# Patient Record
Sex: Female | Born: 1955 | Race: White | Hispanic: No | Marital: Married | Smoking: Former smoker
Health system: Southern US, Community
[De-identification: ages and names within clinical notes are randomized; demographics above are authoritative.]

## PROBLEM LIST (undated history)

## (undated) DIAGNOSIS — K589 Irritable bowel syndrome without diarrhea: Secondary | ICD-10-CM

## (undated) DIAGNOSIS — R112 Nausea with vomiting, unspecified: Secondary | ICD-10-CM

## (undated) DIAGNOSIS — Z9889 Other specified postprocedural states: Secondary | ICD-10-CM

## (undated) DIAGNOSIS — R51 Headache: Secondary | ICD-10-CM

## (undated) DIAGNOSIS — G43909 Migraine, unspecified, not intractable, without status migrainosus: Secondary | ICD-10-CM

## (undated) DIAGNOSIS — Z9109 Other allergy status, other than to drugs and biological substances: Secondary | ICD-10-CM

## (undated) DIAGNOSIS — E0500 Thyrotoxicosis with diffuse goiter without thyrotoxic crisis or storm: Secondary | ICD-10-CM

## (undated) HISTORY — DX: Migraine, unspecified, not intractable, without status migrainosus: G43.909

---

## 1979-02-28 HISTORY — PX: ABDOMINAL HYSTERECTOMY: SHX81

## 2001-12-13 ENCOUNTER — Encounter: Payer: Self-pay | Admitting: Orthopedic Surgery

## 2001-12-13 ENCOUNTER — Ambulatory Visit (HOSPITAL_COMMUNITY): Admission: RE | Admit: 2001-12-13 | Discharge: 2001-12-13 | Payer: Self-pay | Admitting: Orthopedic Surgery

## 2002-01-19 ENCOUNTER — Encounter: Payer: Self-pay | Admitting: Orthopedic Surgery

## 2002-01-25 ENCOUNTER — Encounter: Payer: Self-pay | Admitting: Orthopedic Surgery

## 2002-01-25 ENCOUNTER — Ambulatory Visit (HOSPITAL_COMMUNITY): Admission: RE | Admit: 2002-01-25 | Discharge: 2002-01-25 | Payer: Self-pay | Admitting: Orthopedic Surgery

## 2003-06-30 HISTORY — PX: WASH SINUS: SUR1443

## 2004-06-29 HISTORY — PX: HIP SURGERY: SHX245

## 2004-08-28 ENCOUNTER — Ambulatory Visit: Payer: Self-pay | Admitting: Family Medicine

## 2005-03-04 ENCOUNTER — Ambulatory Visit: Payer: Self-pay | Admitting: Otolaryngology

## 2005-03-09 ENCOUNTER — Ambulatory Visit: Payer: Self-pay | Admitting: Otolaryngology

## 2005-03-21 ENCOUNTER — Inpatient Hospital Stay: Payer: Self-pay | Admitting: Otolaryngology

## 2006-03-24 ENCOUNTER — Ambulatory Visit: Payer: Self-pay | Admitting: Family Medicine

## 2011-07-09 ENCOUNTER — Other Ambulatory Visit: Payer: Self-pay | Admitting: Neurosurgery

## 2011-07-09 DIAGNOSIS — M5410 Radiculopathy, site unspecified: Secondary | ICD-10-CM

## 2011-07-09 DIAGNOSIS — M542 Cervicalgia: Secondary | ICD-10-CM

## 2011-07-13 ENCOUNTER — Ambulatory Visit
Admission: RE | Admit: 2011-07-13 | Discharge: 2011-07-13 | Disposition: A | Discharge status: Home / Self Care | Payer: BC Managed Care – PPO | Source: Ambulatory Visit | Attending: Neurosurgery | Admitting: Neurosurgery

## 2011-07-13 DIAGNOSIS — M542 Cervicalgia: Secondary | ICD-10-CM

## 2011-07-13 DIAGNOSIS — M5410 Radiculopathy, site unspecified: Secondary | ICD-10-CM

## 2011-07-13 MED ORDER — IOHEXOL 300 MG/ML  SOLN
10.0000 mL | Freq: Once | INTRAMUSCULAR | Status: AC | PRN
  Administered 2011-07-13: 10 mL via INTRATHECAL

## 2011-07-13 MED ORDER — DIAZEPAM 5 MG PO TABS
10.0000 mg | ORAL_TABLET | Freq: Once | ORAL | Status: AC
  Administered 2011-07-13: 5 mg via ORAL

## 2011-07-13 NOTE — Progress Notes (Signed)
Patient resting quietly without complaint, denies pain.  Husband at bedside.  jkl

## 2011-07-21 ENCOUNTER — Other Ambulatory Visit: Payer: Self-pay | Admitting: Neurosurgery

## 2011-07-23 ENCOUNTER — Encounter (HOSPITAL_COMMUNITY): Payer: Self-pay | Admitting: *Deleted

## 2011-07-23 ENCOUNTER — Encounter (HOSPITAL_COMMUNITY): Payer: Self-pay | Admitting: Pharmacy Technician

## 2011-07-23 ENCOUNTER — Encounter (HOSPITAL_COMMUNITY)
Admission: RE | Admit: 2011-07-23 | Discharge: 2011-07-23 | Disposition: A | Discharge status: Home / Self Care | Payer: BC Managed Care – PPO | Source: Ambulatory Visit | Attending: Neurosurgery | Admitting: Neurosurgery

## 2011-07-23 LAB — CBC
Hemoglobin: 13.6 g/dL (ref 12.0–15.0)
MCH: 31.1 pg (ref 26.0–34.0)
Platelets: 291 10*3/uL (ref 150–400)
RBC: 4.38 MIL/uL (ref 3.87–5.11)
WBC: 7.5 10*3/uL (ref 4.0–10.5)

## 2011-07-23 LAB — SURGICAL PCR SCREEN
MRSA, PCR: NEGATIVE
Staphylococcus aureus: NEGATIVE

## 2011-07-23 NOTE — Pre-Procedure Instructions (Signed)
20 Tammy [name]  07/23/2011   Your procedure is scheduled on:  Wednesday 07/29/11     Report to [facility] at 900 AM.  Call this number if you have problems the morning of surgery: [phone]   Remember:   Do not eat food:After Midnight.  May have clear liquids: up to 4 Hours before arrival.  Clear liquids include soda, tea, black coffee, apple or grape juice, broth.  Take these medicines the morning of surgery with A SIP OF WATER:  STOP ASPIRIN, COUMADIN, PLAVIX, EFFIENT, HERBAL MEDICINES   Do not wear jewelry, make-up or nail polish.  Do not wear lotions, powders, or perfumes. You may wear deodorant.  Do not shave 48 hours prior to surgery.  Do not bring valuables to the hospital.  Contacts, dentures or bridgework may not be worn into surgery.  Leave suitcase in the car. After surgery it may be brought to your room.  For patients admitted to the hospital, checkout time is 11:00 AM the day of discharge.   Patients discharged the day of surgery will not be allowed to drive home.  Name and phone number of your driver:   Special Instructions: CHG Shower Use Special Wash: 1/2 bottle night before surgery and 1/2 bottle morning of surgery.   Please read over the following fact sheets that you were given: Pain Booklet, Coughing and Deep Breathing, MRSA Information and Surgical Site Infection Prevention

## 2011-07-24 ENCOUNTER — Other Ambulatory Visit (HOSPITAL_COMMUNITY): Payer: BC Managed Care – PPO

## 2011-07-28 MED ORDER — CEFAZOLIN SODIUM 1-5 GM-% IV SOLN
1.0000 g | INTRAVENOUS | Status: DC
  Filled 2011-07-28: qty 50

## 2011-07-29 ENCOUNTER — Encounter (HOSPITAL_COMMUNITY): Payer: Self-pay | Admitting: Anesthesiology

## 2011-07-29 ENCOUNTER — Ambulatory Visit (HOSPITAL_COMMUNITY)
Admission: RE | Admit: 2011-07-29 | Discharge: 2011-07-30 | Disposition: A | Discharge status: Home / Self Care | Payer: BC Managed Care – PPO | Source: Ambulatory Visit | Attending: Neurosurgery | Admitting: Neurosurgery

## 2011-07-29 ENCOUNTER — Ambulatory Visit (HOSPITAL_COMMUNITY): Payer: BC Managed Care – PPO

## 2011-07-29 ENCOUNTER — Encounter (HOSPITAL_COMMUNITY): Payer: Self-pay | Admitting: *Deleted

## 2011-07-29 ENCOUNTER — Encounter (HOSPITAL_COMMUNITY)
Admission: RE | Disposition: A | Discharge status: Home / Self Care | Payer: Self-pay | Source: Ambulatory Visit | Attending: Neurosurgery

## 2011-07-29 ENCOUNTER — Ambulatory Visit (HOSPITAL_COMMUNITY): Payer: BC Managed Care – PPO | Admitting: Anesthesiology

## 2011-07-29 DIAGNOSIS — M4712 Other spondylosis with myelopathy, cervical region: Secondary | ICD-10-CM

## 2011-07-29 DIAGNOSIS — Z01818 Encounter for other preprocedural examination: Secondary | ICD-10-CM | POA: Insufficient documentation

## 2011-07-29 DIAGNOSIS — Z01812 Encounter for preprocedural laboratory examination: Secondary | ICD-10-CM | POA: Insufficient documentation

## 2011-07-29 DIAGNOSIS — Z79899 Other long term (current) drug therapy: Secondary | ICD-10-CM | POA: Insufficient documentation

## 2011-07-29 DIAGNOSIS — M5020 Other cervical disc displacement, unspecified cervical region: Secondary | ICD-10-CM | POA: Insufficient documentation

## 2011-07-29 DIAGNOSIS — Z7902 Long term (current) use of antithrombotics/antiplatelets: Secondary | ICD-10-CM | POA: Insufficient documentation

## 2011-07-29 DIAGNOSIS — M47812 Spondylosis without myelopathy or radiculopathy, cervical region: Secondary | ICD-10-CM | POA: Insufficient documentation

## 2011-07-29 HISTORY — DX: Headache: R51

## 2011-07-29 HISTORY — DX: Nausea with vomiting, unspecified: R11.2

## 2011-07-29 HISTORY — DX: Other allergy status, other than to drugs and biological substances: Z91.09

## 2011-07-29 HISTORY — DX: Other specified postprocedural states: Z98.890

## 2011-07-29 HISTORY — PX: ANTERIOR CERVICAL DECOMP/DISCECTOMY FUSION: SHX1161

## 2011-07-29 SURGERY — ANTERIOR CERVICAL DECOMPRESSION/DISCECTOMY FUSION 3 LEVEL/HARDWARE REMOVAL
Anesthesia: General | Site: Neck | Laterality: Bilateral | Wound class: Clean

## 2011-07-29 MED ORDER — DOCUSATE SODIUM 100 MG PO CAPS
100.0000 mg | ORAL_CAPSULE | Freq: Two times a day (BID) | ORAL | Status: DC
  Administered 2011-07-29: 100 mg via ORAL
  Filled 2011-07-29: qty 1

## 2011-07-29 MED ORDER — DROPERIDOL 2.5 MG/ML IJ SOLN
INTRAMUSCULAR | Status: DC | PRN
  Administered 2011-07-29: 0.625 mg via INTRAVENOUS

## 2011-07-29 MED ORDER — PHENYLEPHRINE HCL 10 MG/ML IJ SOLN
INTRAMUSCULAR | Status: DC | PRN
  Administered 2011-07-29 (×4): 80 ug via INTRAVENOUS
  Administered 2011-07-29: 40 ug via INTRAVENOUS

## 2011-07-29 MED ORDER — GLYCOPYRROLATE 0.2 MG/ML IJ SOLN
INTRAMUSCULAR | Status: DC | PRN
  Administered 2011-07-29: .3 mg via INTRAVENOUS

## 2011-07-29 MED ORDER — BACITRACIN 50000 UNITS IM SOLR
INTRAMUSCULAR | Status: DC | PRN
  Administered 2011-07-29: 13:00:00

## 2011-07-29 MED ORDER — DEXAMETHASONE 4 MG PO TABS
4.0000 mg | ORAL_TABLET | Freq: Four times a day (QID) | ORAL | Status: AC
Start: 2011-07-29 — End: 2011-07-29

## 2011-07-29 MED ORDER — MENTHOL 3 MG MT LOZG: 1.0000 | LOZENGE | OROMUCOSAL | Status: DC | PRN

## 2011-07-29 MED ORDER — ONDANSETRON HCL 4 MG/2ML IJ SOLN: 4.0000 mg | INTRAMUSCULAR | Status: DC | PRN

## 2011-07-29 MED ORDER — OXYCODONE-ACETAMINOPHEN 5-325 MG PO TABS
1.0000 | ORAL_TABLET | ORAL | Status: DC | PRN
  Administered 2011-07-29 – 2011-07-30 (×3): 2 via ORAL
  Filled 2011-07-29 (×3): qty 2

## 2011-07-29 MED ORDER — SUFENTANIL CITRATE 50 MCG/ML IV SOLN
INTRAVENOUS | Status: DC | PRN
  Administered 2011-07-29: 10 ug via INTRAVENOUS
  Administered 2011-07-29: 5 ug via INTRAVENOUS
  Administered 2011-07-29: 15 ug via INTRAVENOUS
  Administered 2011-07-29 (×2): 10 ug via INTRAVENOUS

## 2011-07-29 MED ORDER — 0.9 % SODIUM CHLORIDE (POUR BTL) OPTIME
TOPICAL | Status: DC | PRN
  Administered 2011-07-29: 1000 mL

## 2011-07-29 MED ORDER — MIDAZOLAM HCL 5 MG/5ML IJ SOLN
INTRAMUSCULAR | Status: DC | PRN
  Administered 2011-07-29: 2 mg via INTRAVENOUS

## 2011-07-29 MED ORDER — LACTATED RINGERS IV SOLN: INTRAVENOUS | Status: DC

## 2011-07-29 MED ORDER — DEXAMETHASONE SODIUM PHOSPHATE 4 MG/ML IJ SOLN
4.0000 mg | Freq: Four times a day (QID) | INTRAMUSCULAR | Status: AC
  Administered 2011-07-29: 4 mg via INTRAVENOUS
  Filled 2011-07-29: qty 1

## 2011-07-29 MED ORDER — ONDANSETRON HCL 4 MG/2ML IJ SOLN: 4.0000 mg | Freq: Four times a day (QID) | INTRAMUSCULAR | Status: DC | PRN

## 2011-07-29 MED ORDER — MORPHINE SULFATE 4 MG/ML IJ SOLN: 1.0000 mg | INTRAMUSCULAR | Status: DC | PRN

## 2011-07-29 MED ORDER — FLUTICASONE FUROATE 27.5 MCG/SPRAY NA SUSP: 2.0000 | Freq: Every day | NASAL | Status: DC | PRN

## 2011-07-29 MED ORDER — ZOLPIDEM TARTRATE 5 MG PO TABS: 10.0000 mg | ORAL_TABLET | Freq: Every evening | ORAL | Status: DC | PRN

## 2011-07-29 MED ORDER — LEVOCETIRIZINE DIHYDROCHLORIDE 5 MG PO TABS: 5.0000 mg | ORAL_TABLET | Freq: Every evening | ORAL | Status: DC

## 2011-07-29 MED ORDER — MONTELUKAST SODIUM 10 MG PO TABS
10.0000 mg | ORAL_TABLET | Freq: Every day | ORAL | Status: DC
  Administered 2011-07-29: 10 mg via ORAL
  Filled 2011-07-29 (×2): qty 1

## 2011-07-29 MED ORDER — ACETAMINOPHEN 325 MG PO TABS: 650.0000 mg | ORAL_TABLET | ORAL | Status: DC | PRN

## 2011-07-29 MED ORDER — BACITRACIN 50000 UNITS IM SOLR
INTRAMUSCULAR | Status: AC
  Filled 2011-07-29: qty 1

## 2011-07-29 MED ORDER — LORATADINE 10 MG PO TABS
10.0000 mg | ORAL_TABLET | Freq: Every day | ORAL | Status: DC
  Administered 2011-07-29: 10 mg via ORAL
  Filled 2011-07-29 (×2): qty 1

## 2011-07-29 MED ORDER — PHENOL 1.4 % MT LIQD: 1.0000 | OROMUCOSAL | Status: DC | PRN

## 2011-07-29 MED ORDER — HYDROMORPHONE HCL PF 1 MG/ML IJ SOLN
0.2500 mg | INTRAMUSCULAR | Status: DC | PRN
  Administered 2011-07-29: 0.5 mg via INTRAVENOUS

## 2011-07-29 MED ORDER — ESTRADIOL 0.05 MG/24HR TD PTWK
0.0500 mg | MEDICATED_PATCH | TRANSDERMAL | Status: DC
  Filled 2011-07-29: qty 1

## 2011-07-29 MED ORDER — BUPIVACAINE-EPINEPHRINE 0.5% -1:200000 IJ SOLN
INTRAMUSCULAR | Status: DC | PRN
  Administered 2011-07-29: 10 mL

## 2011-07-29 MED ORDER — DEXAMETHASONE SODIUM PHOSPHATE 4 MG/ML IJ SOLN
INTRAMUSCULAR | Status: DC | PRN
  Administered 2011-07-29: 8 mg via INTRAVENOUS

## 2011-07-29 MED ORDER — PROPOFOL 10 MG/ML IV EMUL
INTRAVENOUS | Status: DC | PRN
  Administered 2011-07-29: 150 mg via INTRAVENOUS

## 2011-07-29 MED ORDER — DIAZEPAM 5 MG PO TABS
5.0000 mg | ORAL_TABLET | Freq: Four times a day (QID) | ORAL | Status: DC | PRN
  Administered 2011-07-30: 5 mg via ORAL
  Filled 2011-07-29: qty 1

## 2011-07-29 MED ORDER — CEFAZOLIN SODIUM 1-5 GM-% IV SOLN
1.0000 g | Freq: Three times a day (TID) | INTRAVENOUS | Status: AC
  Administered 2011-07-29 – 2011-07-30 (×2): 1 g via INTRAVENOUS
  Filled 2011-07-29 (×2): qty 50

## 2011-07-29 MED ORDER — ONDANSETRON HCL 4 MG/2ML IJ SOLN
INTRAMUSCULAR | Status: DC | PRN
  Administered 2011-07-29 (×2): 4 mg via INTRAVENOUS

## 2011-07-29 MED ORDER — BACITRACIN ZINC 500 UNIT/GM EX OINT
TOPICAL_OINTMENT | CUTANEOUS | Status: DC | PRN
  Administered 2011-07-29: 1 via TOPICAL

## 2011-07-29 MED ORDER — ROCURONIUM BROMIDE 100 MG/10ML IV SOLN
INTRAVENOUS | Status: DC | PRN
  Administered 2011-07-29: 50 mg via INTRAVENOUS

## 2011-07-29 MED ORDER — HYDROCODONE-ACETAMINOPHEN 5-325 MG PO TABS: 1.0000 | ORAL_TABLET | ORAL | Status: DC | PRN

## 2011-07-29 MED ORDER — ACETAMINOPHEN 650 MG RE SUPP: 650.0000 mg | RECTAL | Status: DC | PRN

## 2011-07-29 MED ORDER — FLUTICASONE PROPIONATE 50 MCG/ACT NA SUSP
2.0000 | Freq: Every day | NASAL | Status: DC | PRN
  Filled 2011-07-29: qty 16

## 2011-07-29 MED ORDER — HYDROMORPHONE HCL PF 1 MG/ML IJ SOLN
INTRAMUSCULAR | Status: AC
  Administered 2011-07-29: 0.5 mg via INTRAVENOUS
  Filled 2011-07-29: qty 1

## 2011-07-29 MED ORDER — SODIUM CHLORIDE 0.9 % IV SOLN
INTRAVENOUS | Status: AC
  Filled 2011-07-29: qty 500

## 2011-07-29 MED ORDER — NEOSTIGMINE METHYLSULFATE 1 MG/ML IJ SOLN
INTRAMUSCULAR | Status: DC | PRN
  Administered 2011-07-29: 2 mg via INTRAVENOUS

## 2011-07-29 MED ORDER — CEFAZOLIN SODIUM 1-5 GM-% IV SOLN
INTRAVENOUS | Status: DC | PRN
  Administered 2011-07-29: 1 g via INTRAVENOUS

## 2011-07-29 MED ORDER — INFLUENZA VIRUS VACC SPLIT PF IM SUSP
0.5000 mL | INTRAMUSCULAR | Status: DC
  Filled 2011-07-29: qty 0.5

## 2011-07-29 MED ORDER — LACTATED RINGERS IV SOLN
INTRAVENOUS | Status: DC | PRN
  Administered 2011-07-29 (×3): via INTRAVENOUS

## 2011-07-29 MED ORDER — THROMBIN 20000 UNITS EX KIT
PACK | CUTANEOUS | Status: DC | PRN
  Administered 2011-07-29: 13:00:00 via TOPICAL

## 2011-07-29 MED ORDER — PHENYLEPHRINE HCL 10 MG/ML IJ SOLN
10.0000 mg | INTRAVENOUS | Status: DC | PRN
  Administered 2011-07-29: 10 ug/min via INTRAVENOUS

## 2011-07-29 SURGICAL SUPPLY — 68 items
APL SKNCLS STERI-STRIP NONHPOA (GAUZE/BANDAGES/DRESSINGS) ×1
BAG DECANTER FOR FLEXI CONT (MISCELLANEOUS) ×2 IMPLANT
BENZOIN TINCTURE PRP APPL 2/3 (GAUZE/BANDAGES/DRESSINGS) ×2 IMPLANT
BIT DRILL SPINE QC 12 (BIT) ×2 IMPLANT
BLADE SURG 15 STRL LF DISP TIS (BLADE) ×2 IMPLANT
BLADE SURG 15 STRL SS (BLADE) ×4
BLADE ULTRA TIP 2M (BLADE) ×2 IMPLANT
BRUSH SCRUB EZ PLAIN DRY (MISCELLANEOUS) ×2 IMPLANT
BUR BARREL STRAIGHT FLUTE 4.0 (BURR) ×2 IMPLANT
BUR MATCHSTICK NEURO 3.0 LAGG (BURR) ×2 IMPLANT
CANISTER SUCTION 2500CC (MISCELLANEOUS) ×2 IMPLANT
CLOTH BEACON ORANGE TIMEOUT ST (SAFETY) ×2 IMPLANT
COLLAR UNIVERSAL (SOFTGOODS) ×2 IMPLANT
CONT SPEC 4OZ CLIKSEAL STRL BL (MISCELLANEOUS) ×2 IMPLANT
COVER MAYO STAND STRL (DRAPES) ×2 IMPLANT
DEVICE FUSION VIST S 14X14X6MM (Trauma) ×1 IMPLANT
DRAPE LAPAROTOMY 100X72 PEDS (DRAPES) ×2 IMPLANT
DRAPE MICROSCOPE LEICA (MISCELLANEOUS) IMPLANT
DRAPE POUCH INSTRU U-SHP 10X18 (DRAPES) ×2 IMPLANT
DRAPE SURG 17X23 STRL (DRAPES) ×4 IMPLANT
ELECT REM PT RETURN 9FT ADLT (ELECTROSURGICAL) ×2
ELECTRODE REM PT RTRN 9FT ADLT (ELECTROSURGICAL) ×1 IMPLANT
GAUZE SPONGE 4X4 12PLY STRL LF (GAUZE/BANDAGES/DRESSINGS) ×2 IMPLANT
GAUZE SPONGE 4X4 16PLY XRAY LF (GAUZE/BANDAGES/DRESSINGS) IMPLANT
GLOVE BIO SURGEON STRL SZ8.5 (GLOVE) ×2 IMPLANT
GLOVE BIOGEL PI IND STRL 7.5 (GLOVE) ×4 IMPLANT
GLOVE BIOGEL PI INDICATOR 7.5 (GLOVE) ×4
GLOVE ECLIPSE 6.5 STRL STRAW (GLOVE) ×2 IMPLANT
GLOVE ECLIPSE 7.5 STRL STRAW (GLOVE) ×8 IMPLANT
GLOVE EXAM NITRILE LRG STRL (GLOVE) IMPLANT
GLOVE EXAM NITRILE MD LF STRL (GLOVE) ×4 IMPLANT
GLOVE EXAM NITRILE XL STR (GLOVE) IMPLANT
GLOVE EXAM NITRILE XS STR PU (GLOVE) IMPLANT
GLOVE SS BIOGEL STRL SZ 7 (GLOVE) ×3 IMPLANT
GLOVE SS BIOGEL STRL SZ 8 (GLOVE) ×1 IMPLANT
GLOVE SUPERSENSE BIOGEL SZ 7 (GLOVE) ×3
GLOVE SUPERSENSE BIOGEL SZ 8 (GLOVE) ×1
GOWN BRE IMP SLV AUR LG STRL (GOWN DISPOSABLE) ×6 IMPLANT
GOWN BRE IMP SLV AUR XL STRL (GOWN DISPOSABLE) ×4 IMPLANT
KIT BASIN OR (CUSTOM PROCEDURE TRAY) ×2 IMPLANT
KIT ROOM TURNOVER OR (KITS) ×2 IMPLANT
MARKER SKIN DUAL TIP RULER LAB (MISCELLANEOUS) ×2 IMPLANT
NEEDLE HYPO 22GX1.5 SAFETY (NEEDLE) ×2 IMPLANT
NEEDLE SPNL 18GX3.5 QUINCKE PK (NEEDLE) ×2 IMPLANT
NS IRRIG 1000ML POUR BTL (IV SOLUTION) ×2 IMPLANT
PACK LAMINECTOMY NEURO (CUSTOM PROCEDURE TRAY) ×2 IMPLANT
PATTIES SURGICAL .5 X.5 (GAUZE/BANDAGES/DRESSINGS) ×2 IMPLANT
PEEK OPTIMA 14X14X5 (Peek) ×2 IMPLANT
PEEK OPTUMA 14X14X5MM (Peek) ×2 IMPLANT
PIN DISTRACTION 14MM (PIN) ×4 IMPLANT
PLATE ANT CERV XTEND 3 LV 42 (Plate) ×2 IMPLANT
PUTTY 5ML ACTIFUSE ABX (Putty) ×2 IMPLANT
RUBBERBAND STERILE (MISCELLANEOUS) IMPLANT
SCREW XTD VAR 4.2 SELF TAP 12 (Screw) ×16 IMPLANT
SPONGE GAUZE 4X4 12PLY (GAUZE/BANDAGES/DRESSINGS) IMPLANT
SPONGE INTESTINAL PEANUT (DISPOSABLE) ×4 IMPLANT
SPONGE SURGIFOAM ABS GEL 100 (HEMOSTASIS) ×2 IMPLANT
STRIP CLOSURE SKIN 1/2X4 (GAUZE/BANDAGES/DRESSINGS) ×2 IMPLANT
SUT VIC AB 0 CT1 27 (SUTURE) ×1
SUT VIC AB 0 CT1 27XBRD ANTBC (SUTURE) ×1 IMPLANT
SUT VIC AB 3-0 SH 8-18 (SUTURE) ×2 IMPLANT
SYR 20ML ECCENTRIC (SYRINGE) ×2 IMPLANT
TAPE HYPAFIX 4 X10 (GAUZE/BANDAGES/DRESSINGS) ×2 IMPLANT
TOWEL OR 17X24 6PK STRL BLUE (TOWEL DISPOSABLE) ×2 IMPLANT
TOWEL OR 17X26 10 PK STRL BLUE (TOWEL DISPOSABLE) ×2 IMPLANT
TRAY FOLEY CATH 14FRSI W/METER (CATHETERS) ×2 IMPLANT
VISTA S O 14X14X6MM (Trauma) ×2 IMPLANT
WATER STERILE IRR 1000ML POUR (IV SOLUTION) ×2 IMPLANT

## 2011-07-29 NOTE — Op Note (Signed)
Brief history: Patient is a 56 year old white female who complains of neck and on pain. She has failed medical management worked up with a cervical MRI and myelo CT. The studies demonstrate cervical spondylosis stenosis etc. at C4-5 C5-6 and C6-7. I discussed the various treatment options including surgery with her. The patient has weighed the risks, benefits, and alternatives surgery decided proceed with the operation.  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 Delma Officer  Asst.: Dr. Barbaraann Barthel  Anesthesia: Gen. endotracheal  Estimated blood loss: 125 cc  Drains: None  Complications: None  Description of procedure: The patient was brought to the operating room by the anesthesia team. General endotracheal anesthesia was induced. A roll was placed under the patient's shoulders to keep the neck in the neutral position. The patient's anterior cervical region was then prepared with Betadine scrub and Betadine solution. Sterile drapes were applied.  The area to be incised was then injected with Marcaine with epinephrine solution. I then used a scalpel to make a transverse incision in the patient's left anterior neck. I used the Metzenbaum scissors to divide the platysmal muscle and then to dissect medial to the sternocleidomastoid muscle, jugular vein, and carotid artery. I carefully dissected down towards the anterior cervical spine identifying the esophagus and retracting it medially. Then using Kitner swabs to clear soft tissue from  the anterior cervical spine. We then inserted a bent spinal needle into the upper exposed intervertebral disc space. We then obtained intraoperative radiographs confirm our location.  I then used electrocautery to detach the medial border of the longus colli muscle bilaterally from the C4-5, C5-6 and C6-7 intervertebral disc spaces. I then inserted the Caspar self-retaining retractor underneath the longus colli muscle bilaterally to provide exposure.  We then incised the intervertebral disc at  C4-5. We then performed a partial intervertebral discectomy with a pituitary forceps and the Karlin curettes. I then inserted distraction screws into the vertebral bodies at C4 and C5. We then distracted the interspace. We then used the high-speed drill to decorticate the vertebral endplates at C4-5, to drill away the remainder of the intervertebral disc, to drill away some posterior spondylosis, and to thin out the posterior longitudinal ligament. I then incised ligament with the arachnoid knife. We then removed the ligament with a Kerrison punches undercutting the vertebral endplates and decompressing the thecal sac. We then performed foraminotomies about the bilateral C5 nerve roots. This completed the decompression at this level.  We then repeated this procedure in an analogous fashion at C5-6 and C6-7 decompressing the thecal sac at these levels as well as the bilateral C6 and C7 nerve roots. This completed the decompression.  We now turned our to attention to the interbody fusion. We used the trial spacers to determine the appropriate size for the interbody prosthesis. We then pre-filled prosthesis with a combination of local morcellized autograft bone that we obtained during decompression as well as Actifuse bone graft extender. We then inserted the prosthesis into the distracted interspace at C4-5, C5-6 and C6-7. We then removed the distraction screws. There was a good snug fit of the prosthesis in the  interspace.   Having  completed the fusion we now turned attention to the anterior spinal instrumentation. We used the high-speed drill to drill away some anterior spondylosis at the disc spaces so that the plate lay down flat. We selected the appropriate length titanium anterior cervical plate. We laid it along the anterior aspect of the vertebral bodies from C4-C7. We then drilled 12 mm holes at C4, C5, C6 and C7. We then secured the plate to the vertebral bodies by placing two 12 mm self-tapping screws at C4, C5, C6, and C7. We then obtained intraoperative radiograph. The demonstrating good position of the instrumentation. We therefore secured the screws the plate the locking each cam. This completed the instrumentation.  We then obtained hemostasis using bipolar electrocautery. We irrigated the wound out with bacitracin solution. We then removed the retractor. We inspected the esophagus for any damage. There was none apparent. We then reapproximated patient's platysmal muscle with interrupted 3-0 Vicryl suture. We then reapproximated the subcutaneous tissue with interrupted 3-0 Vicryl suture. The skin was reapproximated with Steri-Strips and benzoin. The wound was then covered with bacitracin ointment. A sterile dressing was applied. The drapes were removed. Patient was subsequently extubated by the anesthesia team and transported to the post anesthesia care unit in stable condition. All sponge instrument and needle counts were correct at the end of this case.

## 2011-07-29 NOTE — Anesthesia Procedure Notes (Signed)
Procedure Name: Intubation Date/Time: 07/29/2011 12:18 PM Performed by: Glendora Score Pre-anesthesia Checklist: Patient identified, Emergency Drugs available, Suction available and Patient being monitored Patient Re-evaluated:Patient Re-evaluated prior to inductionOxygen Delivery Method: Circle System Utilized Preoxygenation: Pre-oxygenation with 100% oxygen Intubation Type: IV induction Ventilation: Mask ventilation without difficulty Laryngoscope Size: Miller and 3 Grade View: Grade I Tube type: Oral Tube size: 7.5 mm Number of attempts: 1 Airway Equipment and Method: stylet and LTA Placement Confirmation: ETT inserted through vocal cords under direct vision,  positive ETCO2 and breath sounds checked- equal and bilateral Secured at: 21 cm Tube secured with: Tape Dental Injury: Teeth and Oropharynx as per pre-operative assessment

## 2011-07-29 NOTE — Progress Notes (Signed)
Subjective:  The patient is alert and pleasant. She looks well. She appears comfortable.  Objective: Vital signs in last 24 hours: Temp:  [97.9 F (36.6 C)-98.5 F (36.9 C)] 98.5 F (36.9 C) (01/30 1600) Pulse Rate:  [78] 78  (01/30 0911) Resp:  [18] 18  (01/30 0911) BP: (121)/(78) 121/78 mmHg (01/30 0911) SpO2:  [95 %] 95 % (01/30 0911)  Intake/Output from previous day:   Intake/Output this shift: Total I/O In: 2600 [I.V.:2600] Out: 460 [Urine:310; Blood:150]  Physical exam the patient is alert. Her motor strength is normal her bilateral deltoid biceps triceps lower extremities.  The patient's dressing is clean and dry without evidence of hematoma or shift.  Lab Results: No results found for this basename: WBC:2,HGB:2,HCT:2,PLT:2 in the last 72 hours BMET No results found for this basename: NA:2,K:2,CL:2,CO2:2,GLUCOSE:2,BUN:2,CREATININE:2,CALCIUM:2 in the last 72 hours  Studies/Results: No results found.  Assessment/Plan: The patient is doing well.  LOS: 0 days     Mancil Pfenning D 07/29/2011, 4:07 PM

## 2011-07-29 NOTE — Preoperative (Signed)
Beta Blockers   Reason not to administer Beta Blockers:Not Applicable 

## 2011-07-29 NOTE — Plan of Care (Signed)
Problem: Consults Goal: Diagnosis - Spinal Surgery Outcome: Completed/Met Date Met:  07/29/11 Cervical Spine Fusion

## 2011-07-29 NOTE — Anesthesia Postprocedure Evaluation (Signed)
Anesthesia Post Note  Patient: Tammy [name]  Procedure(s) Performed:  ANTERIOR CERVICAL DECOMPRESSION/DISCECTOMY FUSION 3 LEVEL/HARDWARE REMOVAL - Cervical four-five, five-six, six-seven anterior cervical decompresion with fusion, interbody prothesis, plating and bonegraft  Anesthesia type: General  Patient location: PACU  Post pain: Pain level controlled and Adequate analgesia  Post assessment: Post-op Vital signs reviewed, Patient's Cardiovascular Status Stable, Respiratory Function Stable, Patent Airway and Pain level controlled  Last Vitals:  Filed Vitals:   07/29/11 1600  BP:   Pulse:   Temp: 36.9 C  Resp:     Post vital signs: Reviewed and stable  Level of consciousness: awake, alert  and oriented  Complications: No apparent anesthesia complications

## 2011-07-29 NOTE — Anesthesia Preprocedure Evaluation (Addendum)
Anesthesia Evaluation  Patient identified by MRN, date of birth, ID band Patient awake    Reviewed: Allergy & Precautions, H&P , NPO status , Patient's Chart, lab work & pertinent test results  History of Anesthesia Complications (+) PONV  Airway Mallampati: II      Dental   Pulmonary neg pulmonary ROS,          Cardiovascular neg cardio ROS     Neuro/Psych  Headaches, Negative Psych ROS   GI/Hepatic negative GI ROS, Neg liver ROS,   Endo/Other  Negative Endocrine ROS  Renal/GU negative Renal ROS  Genitourinary negative   Musculoskeletal negative musculoskeletal ROS (+)   Abdominal   Peds negative pediatric ROS (+)  Hematology negative hematology ROS (+)   Anesthesia Other Findings   Reproductive/Obstetrics negative OB ROS                          Anesthesia Physical Anesthesia Plan  ASA: II  Anesthesia Plan: General   Post-op Pain Management:    Induction: Intravenous  Airway Management Planned: Oral ETT  Additional Equipment:   Intra-op Plan:   Post-operative Plan: Extubation in OR  Informed Consent: I have reviewed the patients History and Physical, chart, labs and discussed the procedure including the risks, benefits and alternatives for the proposed anesthesia with the patient or authorized representative who has indicated his/her understanding and acceptance.   Dental advisory given  Plan Discussed with: CRNA, Surgeon and Anesthesiologist  Anesthesia Plan Comments:        Anesthesia Quick Evaluation

## 2011-07-29 NOTE — Transfer of Care (Signed)
Immediate Anesthesia Transfer of Care Note  Patient: Vanissa Strength  Procedure(s) Performed:  ANTERIOR CERVICAL DECOMPRESSION/DISCECTOMY FUSION 3 LEVEL/HARDWARE REMOVAL - Cervical four-five, five-six, six-seven anterior cervical decompresion with fusion, interbody prothesis, plating and bonegraft  Patient Location: PACU  Anesthesia Type: General  Level of Consciousness: awake, alert  and oriented  Airway & Oxygen Therapy: Patient Spontanous Breathing and Patient connected to nasal cannula oxygen  Post-op Assessment: Report given to PACU RN, Post -op Vital signs reviewed and stable, Patient moving all extremities X 4 and Patient able to stick tongue midline  Post vital signs: Reviewed and stable  Complications: No apparent anesthesia complications

## 2011-07-29 NOTE — H&P (Signed)
Subjective: The patient is a 56 year old white female is a chronic neck and left arm pain. She has failed medical management and was worked up with a cervical MRI and cervical myelo CT. These studies demonstrated the patient had degenerative changes spondylosis stenosis etc. at C4-5, C5-6 and C6-7. I discussed the various treatment options with the patient including surgery. The patient has weighed the risks, benefits, and alternatives surgery decided proceed with a three-level anterior cervical discectomy fusion and plating.  Past Medical History  Diagnosis Date  . PONV (postoperative nausea and vomiting)   . Environmental allergies   . Headache     last one 1 year ago    Past Surgical History  Procedure Date  . Abdominal hysterectomy 1980's    total  . Wash sinus 2005  . Hip surgery 2006    repair post injury    Allergies  Allergen Reactions  . Sulfa Antibiotics Hives  . Molds & Smuts     History  Substance Use Topics  . Smoking status: Not on file  . Smokeless tobacco: Not on file  . Alcohol Use: Yes     occasioanly maybe once a month    Family History  Problem Relation Age of Onset  . Anesthesia problems Neg Hx    Prior to Admission medications   Medication Sig Start Date End Date Taking? Authorizing Provider  estradiol (VIVELLE-DOT) 0.1 MG/24HR Place 1 patch onto the skin 2 (two) times a week.   Yes Historical Provider, MD  etodolac (LODINE) 500 MG tablet Take 500 mg by mouth 2 (two) times daily.   Yes Historical Provider, MD  fluticasone (VERAMYST) 27.5 MCG/SPRAY nasal spray Place 2 sprays into the nose daily as needed. For allergies   Yes Historical Provider, MD  levocetirizine (XYZAL) 5 MG tablet Take 5 mg by mouth every evening.   Yes Historical Provider, MD  montelukast (SINGULAIR) 10 MG tablet Take 10 mg by mouth at bedtime.   Yes Historical Provider, MD  orphenadrine (NORFLEX) 100 MG tablet Take 100 mg by mouth 2 (two) times daily.   Yes Historical Provider, MD    oxyCODONE-acetaminophen (PERCOCET) 5-325 MG per tablet Take 1 tablet by mouth every 6 (six) hours as needed. For pain   Yes Historical Provider, MD     Review of Systems  Positive ROS: As above  All other systems have been reviewed and were otherwise negative with the exception of those mentioned in the HPI and as above.  Objective: Vital signs in last 24 hours: Temp:  [97.9 F (36.6 C)] 97.9 F (36.6 C) (01/30 0911) Pulse Rate:  [78] 78  (01/30 0911) Resp:  [18] 18  (01/30 0911) BP: (121)/(78) 121/78 mmHg (01/30 0911) SpO2:  [95 %] 95 % (01/30 0911)  General Appearance: Alert, cooperative, no distress, appears stated age Head: Normocephalic, without obvious abnormality, atraumatic Eyes: PERRL, conjunctiva/corneas clear, EOM's intact, fundi benign, both eyes      Ears: Normal TM's and external ear canals, both ears Throat: Lips, mucosa, and tongue normal; teeth and gums normal Neck: Supple, symmetrical, trachea midline, no adenopathy; thyroid: No enlargement/tenderness/nodules; no carotid bruit or JVD Back: Symmetric, no curvature, ROM normal, no CVA tenderness Lungs: Clear to auscultation bilaterally, respirations unlabored Heart: Regular rate and rhythm, S1 and S2 normal, no murmur, rub or gallop Abdomen: Soft, non-tender, bowel sounds active all four quadrants, no masses, no organomegaly Extremities: Extremities normal, atraumatic, no cyanosis or edema Pulses: 2+ and symmetric all extremities Skin: Skin color, texture,  turgor normal, no rashes or lesions  NEUROLOGIC:   Mental status: alert and oriented, no aphasia, good attention span, Fund of knowledge/ memory ok Motor Exam - grossly normal Sensory Exam - grossly normal except she has decreased light-touch sensation in the C8 dermatome bilaterally Reflexes: Normal Coordination - grossly normal Gait - grossly normal Balance - grossly normal Cranial Nerves: I: smell Not tested  II: visual acuity  OS: Normal    OD:  Normal   II: visual fields Full to confrontation  II: pupils Equal, round, reactive to light  III,VII: ptosis None  III,IV,VI: extraocular muscles  Full ROM  V: mastication Normal  V: facial light touch sensation  Normal  V,VII: corneal reflex  Present  VII: facial muscle function - upper  Normal  VII: facial muscle function - lower Normal  VIII: hearing Not tested  IX: soft palate elevation  Normal  IX,X: gag reflex Present  XI: trapezius strength  5/5  XI: sternocleidomastoid strength 5/5  XI: neck flexion strength  5/5  XII: tongue strength  Normal    Data Review Lab Results  Component Value Date   WBC 7.5 07/23/2011   HGB 13.6 07/23/2011   HCT 40.3 07/23/2011   MCV 92.0 07/23/2011   PLT 291 07/23/2011   No results found for this basename: NA, K, CL, CO2, BUN, creatinine, glucose   No results found for this basename: INR, PROTIME    Assessment/Plan: C4-5, C5-6, C6-7 herniated disc, spondylosis, stenosis, cervicalgia, cervical radiculopathy/myelopathy: I have discussed situation with the patient. I have reviewed the imaging studies with her and pointed out the abnormalities. I discussed the various treatment options including a C4-5, C5-6, and C6-7 anterior cervical discectomy, fusion, and plating. I have described the surgery to her and showed her surgical models. We have discussed the risks, benefits, alternatives and likelihood of achieving our goals with surgery. I've answered all the patient's questions. She would like to proceed with surgery.   Tammy [name] D 07/29/2011 11:51 AM

## 2011-07-30 MED ORDER — OXYCODONE-ACETAMINOPHEN 10-325 MG PO TABS: 1.0000 | ORAL_TABLET | ORAL | Status: AC | PRN

## 2011-07-30 MED ORDER — PNEUMOCOCCAL VAC POLYVALENT 25 MCG/0.5ML IJ INJ
0.5000 mL | INJECTION | Freq: Once | INTRAMUSCULAR | Status: AC
  Administered 2011-07-30: 0.5 mL via INTRAMUSCULAR
  Filled 2011-07-30: qty 0.5

## 2011-07-30 MED ORDER — DSS 100 MG PO CAPS: 100.0000 mg | ORAL_CAPSULE | Freq: Two times a day (BID) | ORAL | Status: AC

## 2011-07-30 MED ORDER — DIAZEPAM 5 MG PO TABS: 5.0000 mg | ORAL_TABLET | Freq: Four times a day (QID) | ORAL | Status: AC | PRN

## 2011-07-30 NOTE — Discharge Summary (Signed)
Physician Discharge Summary  Patient ID: Tammy [name] MRN: [id number] DOB/AGE: 54-Jul-1957 54 y.o.  Admit date: 07/29/2011 Discharge date: 07/30/2011  Admission Diagnoses: Cervical spondylosis/stenosis/myelopathy/radiculopathy  Discharge Diagnoses: The same Active Problems:  * No active hospital problems. *    Discharged Condition: good  Hospital Course: I admitted the patient to [facility] [place] on 07/29/2011. On that day I performed a C4-5, C5-6 and C6-7 intracervical discectomy, fusion, and plating. The surgery went well. The patient's postoperative course was unremarkable. On postop day #1 the patient looks well and was requesting discharge to home. She was given oral and written discharge instructions. All her questions were answered.  Consults: None Significant Diagnostic Studies: None Treatments: C4-5, C5-6, and C6-7 anterior cervical discectomy , fusion, and plating Discharge Exam: Blood pressure 93/55, pulse 100, temperature 98 F (36.7 C), temperature source Oral, resp. rate 16, SpO2 97.00%. The patient is alert and oriented. She looks well. Her dressing is clean and dry without evidence of hematoma or shift. Her motor strength is normal in all 4 extremities.  Disposition: Home  Discharge Orders    Future Orders Please Complete By Expires   Diet - low sodium heart healthy      Increase activity slowly      Discharge instructions      Comments:   Call [phone] for a followup appointment.   Remove dressing in 48 hours      Call MD for:  temperature >100.4      Call MD for:  persistant nausea and vomiting      Call MD for:  severe uncontrolled pain      Call MD for:  redness, tenderness, or signs of infection (pain, swelling, redness, odor or green/yellow discharge around incision site)      Call MD for:  difficulty breathing, headache or visual disturbances      Call MD for:  hives      Call MD for:  persistant dizziness or light-headedness      Call MD for:   extreme fatigue        Medication List  As of 07/30/2011  7:53 AM   STOP taking these medications         etodolac 500 MG tablet      orphenadrine 100 MG tablet      oxyCODONE-acetaminophen 5-325 MG per tablet         TAKE these medications         diazepam 5 MG tablet   Commonly known as: VALIUM   Take 1 tablet (5 mg total) by mouth every 6 (six) hours as needed.      DSS 100 MG Caps   Take 100 mg by mouth 2 (two) times daily.      estradiol 0.1 MG/24HR   Commonly known as: VIVELLE-DOT   Place 1 patch onto the skin 2 (two) times a week.      fluticasone 27.5 MCG/SPRAY nasal spray   Commonly known as: VERAMYST   Place 2 sprays into the nose daily as needed. For allergies      levocetirizine 5 MG tablet   Commonly known as: XYZAL   Take 5 mg by mouth every evening.      montelukast 10 MG tablet   Commonly known as: SINGULAIR   Take 10 mg by mouth at bedtime.      oxyCODONE-acetaminophen 10-325 MG per tablet   Commonly known as: PERCOCET   Take 1 tablet by mouth every 4 (four) hours as  needed for pain.             SignedCristi Loron 07/30/2011, 7:53 AM

## 2011-07-30 NOTE — Progress Notes (Signed)
Orthopedic Tech Progress Note Patient Details:  Tammy [name] 04-Apr-1956 [id number]  Other Ortho Devices Ortho Device Location: SOFT COLLAR FOR OTH O TECH AND ASPEN COLLAR FOR BIO TECH Ortho Device Interventions: Application   Shawnie Pons 07/30/2011, 9:47 AM

## 2011-07-31 ENCOUNTER — Encounter (HOSPITAL_COMMUNITY): Payer: Self-pay | Admitting: Neurosurgery

## 2017-03-18 DIAGNOSIS — R21 Rash and other nonspecific skin eruption: Secondary | ICD-10-CM | POA: Diagnosis not present

## 2017-03-18 DIAGNOSIS — J3089 Other allergic rhinitis: Secondary | ICD-10-CM | POA: Diagnosis not present

## 2017-03-18 DIAGNOSIS — R05 Cough: Secondary | ICD-10-CM | POA: Diagnosis not present

## 2017-03-18 DIAGNOSIS — H1045 Other chronic allergic conjunctivitis: Secondary | ICD-10-CM | POA: Diagnosis not present

## 2017-05-07 DIAGNOSIS — M65321 Trigger finger, right index finger: Secondary | ICD-10-CM | POA: Diagnosis not present

## 2017-05-28 DIAGNOSIS — M6530 Trigger finger, unspecified finger: Secondary | ICD-10-CM | POA: Diagnosis not present

## 2017-06-24 DIAGNOSIS — M65322 Trigger finger, left index finger: Secondary | ICD-10-CM | POA: Diagnosis not present

## 2017-06-24 DIAGNOSIS — M65352 Trigger finger, left little finger: Secondary | ICD-10-CM | POA: Diagnosis not present

## 2017-06-24 DIAGNOSIS — M65312 Trigger thumb, left thumb: Secondary | ICD-10-CM | POA: Diagnosis not present

## 2017-06-24 DIAGNOSIS — M65321 Trigger finger, right index finger: Secondary | ICD-10-CM | POA: Diagnosis not present

## 2017-06-24 DIAGNOSIS — M65311 Trigger thumb, right thumb: Secondary | ICD-10-CM | POA: Diagnosis not present

## 2017-09-08 DIAGNOSIS — Z6824 Body mass index (BMI) 24.0-24.9, adult: Secondary | ICD-10-CM | POA: Diagnosis not present

## 2017-09-08 DIAGNOSIS — N951 Menopausal and female climacteric states: Secondary | ICD-10-CM | POA: Diagnosis not present

## 2017-09-08 DIAGNOSIS — Z1231 Encounter for screening mammogram for malignant neoplasm of breast: Secondary | ICD-10-CM | POA: Diagnosis not present

## 2017-09-08 DIAGNOSIS — Z01419 Encounter for gynecological examination (general) (routine) without abnormal findings: Secondary | ICD-10-CM | POA: Diagnosis not present

## 2017-09-08 DIAGNOSIS — Z13220 Encounter for screening for lipoid disorders: Secondary | ICD-10-CM | POA: Diagnosis not present

## 2017-09-09 DIAGNOSIS — J069 Acute upper respiratory infection, unspecified: Secondary | ICD-10-CM | POA: Diagnosis not present

## 2017-09-09 DIAGNOSIS — J029 Acute pharyngitis, unspecified: Secondary | ICD-10-CM | POA: Diagnosis not present

## 2017-10-12 DIAGNOSIS — J3089 Other allergic rhinitis: Secondary | ICD-10-CM | POA: Diagnosis not present

## 2017-10-12 DIAGNOSIS — R05 Cough: Secondary | ICD-10-CM | POA: Diagnosis not present

## 2017-10-12 DIAGNOSIS — J209 Acute bronchitis, unspecified: Secondary | ICD-10-CM | POA: Diagnosis not present

## 2017-10-12 DIAGNOSIS — J019 Acute sinusitis, unspecified: Secondary | ICD-10-CM | POA: Diagnosis not present

## 2018-01-20 DIAGNOSIS — R05 Cough: Secondary | ICD-10-CM | POA: Diagnosis not present

## 2018-01-20 DIAGNOSIS — J209 Acute bronchitis, unspecified: Secondary | ICD-10-CM | POA: Diagnosis not present

## 2018-01-20 DIAGNOSIS — J019 Acute sinusitis, unspecified: Secondary | ICD-10-CM | POA: Diagnosis not present

## 2018-01-20 DIAGNOSIS — J3089 Other allergic rhinitis: Secondary | ICD-10-CM | POA: Diagnosis not present

## 2018-01-31 DIAGNOSIS — H5022 Vertical strabismus, left eye: Secondary | ICD-10-CM | POA: Diagnosis not present

## 2018-03-17 DIAGNOSIS — R05 Cough: Secondary | ICD-10-CM | POA: Diagnosis not present

## 2018-03-17 DIAGNOSIS — J3089 Other allergic rhinitis: Secondary | ICD-10-CM | POA: Diagnosis not present

## 2018-03-17 DIAGNOSIS — H1045 Other chronic allergic conjunctivitis: Secondary | ICD-10-CM | POA: Diagnosis not present

## 2018-03-17 DIAGNOSIS — R21 Rash and other nonspecific skin eruption: Secondary | ICD-10-CM | POA: Diagnosis not present

## 2018-04-08 DIAGNOSIS — H1045 Other chronic allergic conjunctivitis: Secondary | ICD-10-CM | POA: Diagnosis not present

## 2018-04-08 DIAGNOSIS — R05 Cough: Secondary | ICD-10-CM | POA: Diagnosis not present

## 2018-04-08 DIAGNOSIS — R21 Rash and other nonspecific skin eruption: Secondary | ICD-10-CM | POA: Diagnosis not present

## 2018-04-08 DIAGNOSIS — J3089 Other allergic rhinitis: Secondary | ICD-10-CM | POA: Diagnosis not present

## 2018-04-08 DIAGNOSIS — H469 Unspecified optic neuritis: Secondary | ICD-10-CM | POA: Diagnosis not present

## 2018-04-08 DIAGNOSIS — H532 Diplopia: Secondary | ICD-10-CM | POA: Diagnosis not present

## 2018-04-15 DIAGNOSIS — N3941 Urge incontinence: Secondary | ICD-10-CM | POA: Diagnosis not present

## 2018-04-15 DIAGNOSIS — Z79890 Hormone replacement therapy (postmenopausal): Secondary | ICD-10-CM | POA: Diagnosis not present

## 2018-04-15 DIAGNOSIS — Z0000 Encounter for general adult medical examination without abnormal findings: Secondary | ICD-10-CM | POA: Diagnosis not present

## 2018-04-29 DIAGNOSIS — Z0000 Encounter for general adult medical examination without abnormal findings: Secondary | ICD-10-CM | POA: Diagnosis not present

## 2018-05-03 ENCOUNTER — Ambulatory Visit (INDEPENDENT_AMBULATORY_CARE_PROVIDER_SITE_OTHER): Payer: BLUE CROSS/BLUE SHIELD | Admitting: Neurology

## 2018-05-03 ENCOUNTER — Encounter: Payer: Self-pay | Admitting: Neurology

## 2018-05-03 VITALS — BP 119/81 | HR 78 | Ht 58.5 in | Wt 115.0 lb

## 2018-05-03 DIAGNOSIS — E0590 Thyrotoxicosis, unspecified without thyrotoxic crisis or storm: Secondary | ICD-10-CM

## 2018-05-03 DIAGNOSIS — G7000 Myasthenia gravis without (acute) exacerbation: Secondary | ICD-10-CM | POA: Diagnosis not present

## 2018-05-03 DIAGNOSIS — H532 Diplopia: Secondary | ICD-10-CM

## 2018-05-03 MED ORDER — PYRIDOSTIGMINE BROMIDE 60 MG PO TABS: 60.0000 mg | ORAL_TABLET | Freq: Three times a day (TID) | ORAL | 6 refills | Status: DC

## 2018-05-03 MED ORDER — PREDNISONE 5 MG PO TABS: 5.0000 mg | ORAL_TABLET | Freq: Every day | ORAL | 3 refills | Status: DC

## 2018-05-03 MED ORDER — MYCOPHENOLATE MOFETIL 500 MG PO TABS: 1000.0000 mg | ORAL_TABLET | Freq: Two times a day (BID) | ORAL | 6 refills | Status: DC

## 2018-05-03 NOTE — Progress Notes (Signed)
PATIENT: Tammy [name] DOB: April 29, 1956  Chief Complaint  Patient presents with  . New Patient (Initial Visit)    optic neuropathy & myasthenia gravis. Tested positive per pt. brought labs today, thyroid "not working"  . Referral    Danice Goltz, MD  . Room 4    She is here with her husband Tammy [name]     HISTORICAL  Tammy [name] is a 62 years old female, seen in request by her ophthalmologist Dr. Manuella Ghazi, [place] for evaluation of double vision, initial evaluation was on May 03, 2018.  I have reviewed and summarized the referring note from the referring physician dated April 08, 2018, she was evaluated by her ophthalmologist Dr. 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  Without clear trigger event, patient noticed mild blurry vision when she is driving since September 2019, later she was able to discern it was double vision, double vision gets better closing either left or right eye, the position of the double vision changes, if she closed one eye, the double vision might improve, her double vision also varies, more obvious when she gets tired, and at the end of the day.  She had a history of sinus surgery in 3662, which is complicated by periorbital infection, misalignment of her vision, which lasted for 3 to 4 months, during that period of time, she did have binocular double vision,  She denies swallowing difficulty, denies limb muscle weakness, denies chewing difficulty,  Recently she also had weight loss.  TSH was significantly decreased 0.008 with elevated free T36.2, free T4 2.45 normal CBC hemoglobin of 14.4, CMP, LDL was 100, cholesterol was 187  Laboratory evaluation on May 09, 2018 showed positive acetylcholine binding antibody, with titer of 1.62, blocking antibody 40,  REVIEW OF SYSTEMS: Full 14 system review of systems performed and notable only  for weight loss, fatigue, trouble swallowing, double vision, shortness of breath, incontinence, difficulty swallowing, anxiety, recent thoughts All other review of systems were negative.  ALLERGIES: Allergies  Allergen Reactions  . Sulfa Antibiotics Hives  . Molds & Smuts     HOME MEDICATIONS: Current Outpatient Medications  Medication Sig Dispense Refill  . BREO ELLIPTA 100-25 MCG/INH AEPB Inhale 1 puff into the lungs daily.  3  . estradiol (VIVELLE-DOT) 0.1 MG/24HR Place 1 patch onto the skin 2 (two) times a week.    . fluticasone (VERAMYST) 27.5 MCG/SPRAY nasal spray Place 2 sprays into the nose daily as needed. For allergies    . hydrOXYzine (ATARAX/VISTARIL) 25 MG tablet Take 25 mg by mouth as needed.    Marland Kitchen levocetirizine (XYZAL) 5 MG tablet Take 5 mg by mouth every evening.    . montelukast (SINGULAIR) 10 MG tablet Take 10 mg by mouth at bedtime.    Marland Kitchen oxybutynin (DITROPAN) 5 MG tablet Take 5 mg by mouth 2 (two) times daily.  0   No current facility-administered medications for this visit.     PAST MEDICAL HISTORY: Past Medical History:  Diagnosis Date  . Environmental allergies   . Headache(784.0)    last one 1 year ago  . Migraines   . PONV (postoperative nausea and vomiting)     PAST SURGICAL HISTORY: Past Surgical History:  Procedure Laterality Date  . ABDOMINAL HYSTERECTOMY  1980's   total  . ANTERIOR CERVICAL DECOMP/DISCECTOMY FUSION  07/29/2011   Procedure: ANTERIOR CERVICAL DECOMPRESSION/DISCECTOMY FUSION 3 LEVEL/HARDWARE REMOVAL;  Surgeon: Ophelia Charter, MD;  Location: [facility];  Service: Neurosurgery;  Laterality: Bilateral;  Cervical four-five, five-six, six-seven anterior cervical decompresion with fusion, interbody prothesis, plating and bonegraft  . HIP SURGERY  2006   repair post injury  . [facility] SINUS  2005    FAMILY HISTORY: Family History  Problem Relation Age of Onset  . COPD Mother   . Anesthesia problems Neg Hx     SOCIAL  HISTORY: Social History   Socioeconomic History  . Marital status: Married    Spouse name: Not on file  . Number of children: Not on file  . Years of education: 73  . Highest education level: Bachelor's degree (e.g., BA, AB, BS)  Occupational History  . Not on file  Social Needs  . Financial resource strain: Not on file  . Food insecurity:    Worry: Not on file    Inability: Not on file  . Transportation needs:    Medical: Not on file    Non-medical: Not on file  Tobacco Use  . Smoking status: Former Smoker    Packs/day: 0.50    Years: 20.00    Pack years: 10.00    Types: Cigarettes  . Smokeless tobacco: Never Used  Substance and Sexual Activity  . Alcohol use: Yes    Comment: 6 times a year   . Drug use: Never  . Sexual activity: Not on file  Lifestyle  . Physical activity:    Days per week: Not on file    Minutes per session: Not on file  . Stress: Not on file  Relationships  . Social connections:    Talks on phone: Not on file    Gets together: Not on file    Attends religious service: Not on file    Active member of club or organization: Not on file    Attends meetings of clubs or organizations: Not on file    Relationship status: Not on file  . Intimate partner violence:    Fear of current or ex partner: Not on file    Emotionally abused: Not on file    Physically abused: Not on file    Forced sexual activity: Not on file  Other Topics Concern  . Not on file  Social History Narrative   Lives at home with husband   Left handed   Caffeine: daily, 2 cups      PHYSICAL EXAM   Vitals:   05/03/18 1051  BP: 119/81  Pulse: 78  Weight: 115 lb (52.2 kg)  Height: 4' 10.5" (1.486 m)    Not recorded      Body mass index is 23.63 kg/m.  PHYSICAL EXAMNIATION:  Gen: NAD, conversant, well nourised, obese, well groomed                     Cardiovascular: Regular rate rhythm, no peripheral edema, warm, nontender. Eyes: Conjunctivae clear without  exudates or hemorrhage Neck: Supple, no carotid bruits. Pulmonary: Clear to auscultation bilaterally   NEUROLOGICAL EXAM:  MENTAL STATUS: Speech:    Speech is normal; fluent and spontaneous with normal comprehension.  Cognition:     Orientation to time, place and person     Normal recent and remote memory     Normal Attention span and concentration     Normal Language, naming, repeating,spontaneous speech     Fund of knowledge   CRANIAL NERVES: CN II: Visual fields are full to confrontation. Fundoscopic exam is normal  with sharp discs and no vascular changes. Pupils are round equal and briskly reactive to light. CN III, IV, VI: extraocular movement are normal. No ptosis. Right superio-exophoria, left inferio-exophoria, CN V: Facial sensation is intact to pinprick in all 3 divisions bilaterally. Corneal responses are intact.  CN VII: Face is symmetric with normal eye closure and smile. CN VIII: Hearing is normal to rubbing fingers CN IX, X: Palate elevates symmetrically. Phonation is normal. CN XI: Head turning and shoulder shrug are intact CN XII: Tongue is midline with normal movements and no atrophy.  MOTOR: There is no pronator drift of out-stretched arms. Muscle bulk and tone are normal. Muscle strength is normal.  REFLEXES: Reflexes are 2+ and symmetric at the biceps, triceps, knees, and ankles. Plantar responses are flexor.  SENSORY: Intact to light touch, pinprick, positional sensation and vibratory sensation are intact in fingers and toes.  COORDINATION: Rapid alternating movements and fine finger movements are intact. There is no dysmetria on finger-to-nose and heel-knee-shin.    GAIT/STANCE: Posture is normal. Gait is steady with normal steps, base, arm swing, and turning. Heel and toe walking are normal. Tandem gait is normal.  Romberg is absent.   DIAGNOSTIC DATA (LABS, IMAGING, TESTING) - I reviewed patient records, labs, notes, testing and imaging myself  where available.   ASSESSMENT AND PLAN  AYRIANA WIX is a 61 y.o. female   Seropositive myasthenia gravis  Confirmed by positive acetylcholine binding antibody  Complete evaluation with MRI of the brain  CT of the chest without contrast to rule out thymus pathology  Mestinon 60 mg 3 times a day  Prednisone starting from 20 mg daily, with decremental 5 mg every 2 weeks  Will consider CellCept 500 mg 2 tablets twice a day as steroid sparing agent  Newly diagnosis of hyperthyroidism  Endocrinologist evaluation is pending   Marcial Pacas, M.D. Ph.D.  [facility] [address], [place], [place] [address] Ph: [phone] Fax: [phone]  CC: Danice Goltz, MD

## 2018-05-09 ENCOUNTER — Encounter: Payer: Self-pay | Admitting: Neurology

## 2018-05-09 ENCOUNTER — Telehealth: Payer: Self-pay | Admitting: Neurology

## 2018-05-09 ENCOUNTER — Other Ambulatory Visit: Payer: Self-pay | Admitting: *Deleted

## 2018-05-09 MED ORDER — MYCOPHENOLATE MOFETIL 500 MG PO TABS: 1000.0000 mg | ORAL_TABLET | Freq: Two times a day (BID) | ORAL | 61 refills | Status: DC

## 2018-05-09 MED ORDER — MYCOPHENOLATE MOFETIL 500 MG PO TABS: 1000.0000 mg | ORAL_TABLET | Freq: Two times a day (BID) | ORAL | 1 refills | Status: DC

## 2018-05-09 NOTE — Telephone Encounter (Signed)
[employer] pending faxed clinical notes

## 2018-05-09 NOTE — Telephone Encounter (Addendum)
Dr. Krista Blue sent Cellcept and Mestinon in for this patient.  I called the patient's local pharmacy and spoke to [place].  States her insurance company requires Cellcept to be sent to [facility].  The prescription has been sent.  The patient is aware and will contact them to schedule deliver.  She was able to fill her Mestinon at the local pharmacy.

## 2018-05-09 NOTE — Telephone Encounter (Signed)
Patient stated she spoke to her pharmacy about a rx and they informed her that that type of prescription comes to a specialty pharmacy and she was wondering if we have received a call from them. She also didn't know the name of the medicine.

## 2018-05-10 NOTE — Telephone Encounter (Signed)
Noted, thank you

## 2018-05-10 NOTE — Telephone Encounter (Signed)
MRI of brain: [id number],   CT chest wo contrast:

## 2018-05-10 NOTE — Telephone Encounter (Signed)
lvm for pt to be aware. I gave her GI phone number of [phone] and to give them a call if she has not heard from them in the next 2-3 business days.

## 2018-05-10 NOTE — Telephone Encounter (Signed)
I called [employer] and spoke to nurse Janett Billow and gave her more clinical information she was unable to approve it on her level. The phone number for the peer to peer is [phone]. The member ID is [id number] & DOB is [birth year]. The case does close on Wednesday 05/11/18.

## 2018-05-16 ENCOUNTER — Other Ambulatory Visit: Payer: Self-pay | Admitting: *Deleted

## 2018-05-16 MED ORDER — MYCOPHENOLATE MOFETIL 500 MG PO TABS: 1000.0000 mg | ORAL_TABLET | Freq: Two times a day (BID) | ORAL | 11 refills | Status: DC

## 2018-05-16 NOTE — Telephone Encounter (Signed)
30 day rx has been sent the mail order pharmacy.

## 2018-05-16 NOTE — Telephone Encounter (Signed)
Pt called stating her insurance will not fill medication mycophenolate (CELLCEPT) 500 MG tablet while written for a 90 days supply, stating it will need to be submitted as a 30 day supply

## 2018-05-18 ENCOUNTER — Encounter: Payer: Self-pay | Admitting: Internal Medicine

## 2018-05-18 ENCOUNTER — Telehealth: Payer: Self-pay | Admitting: Neurology

## 2018-05-18 ENCOUNTER — Ambulatory Visit (INDEPENDENT_AMBULATORY_CARE_PROVIDER_SITE_OTHER): Payer: BLUE CROSS/BLUE SHIELD | Admitting: Internal Medicine

## 2018-05-18 ENCOUNTER — Ambulatory Visit
Admission: RE | Admit: 2018-05-18 | Discharge: 2018-05-18 | Disposition: A | Discharge status: Home / Self Care | Payer: BLUE CROSS/BLUE SHIELD | Source: Ambulatory Visit | Attending: Neurology | Admitting: Neurology

## 2018-05-18 VITALS — BP 134/84 | HR 68 | Resp 16 | Ht 59.0 in | Wt 116.0 lb

## 2018-05-18 DIAGNOSIS — R079 Chest pain, unspecified: Secondary | ICD-10-CM | POA: Diagnosis not present

## 2018-05-18 DIAGNOSIS — H532 Diplopia: Secondary | ICD-10-CM

## 2018-05-18 DIAGNOSIS — R7989 Other specified abnormal findings of blood chemistry: Secondary | ICD-10-CM | POA: Diagnosis not present

## 2018-05-18 DIAGNOSIS — R946 Abnormal results of thyroid function studies: Secondary | ICD-10-CM | POA: Diagnosis not present

## 2018-05-18 DIAGNOSIS — E0590 Thyrotoxicosis, unspecified without thyrotoxic crisis or storm: Secondary | ICD-10-CM

## 2018-05-18 DIAGNOSIS — Z23 Encounter for immunization: Secondary | ICD-10-CM

## 2018-05-18 DIAGNOSIS — G7000 Myasthenia gravis without (acute) exacerbation: Secondary | ICD-10-CM

## 2018-05-18 LAB — TSH

## 2018-05-18 LAB — T4, FREE: FREE T4: 1.29 ng/dL (ref 0.60–1.60)

## 2018-05-18 NOTE — Progress Notes (Signed)
Name: Tammy [name]  MRN/ DOB: [id number], 09/10/1955    Age/ Sex: 62 y.o., female    PCP: Patient, No Pcp Per   Reason for Endocrinology Evaluation: Abnormal Thyroid function Test      Date of Initial Endocrinology Evaluation: 05/18/2018     HPI: Tammy [name] is a 62 y.o. female with a past medical history of Myasthenia Gravis . The patient presented for initial endocrinology clinic visit on 05/18/2018 for consultative assistance with her TFT's.   Patient was recently diagnosed with MG in November, 2019 She is currently on Cellcept and Mestinon as well as prednisone which was started 2 weeks ago, she was initially on 20 mg a day but today is on 15 mg    Prior to MG diagnosis she went to her PCP for an annual check up and TFT's were abnormal with a TSH of 0.008 uIU/mL and elevated T4 of 2.45 ng/dL.  Pt noted weight gain last year, she also developed a mysterious skin condition sound like dermatograpahia, she is on anti-histamine with minimal improvement. She also described hair loss and brittle nails.    Denies local neck symptoms such as swelling, pain, but has occasional dysphagia and voice hoarseness.  She also has left eye burning and pruritic sensation when tired but no tearing.    Sister with thyroid disease and RA      HISTORY:  Past Medical History:  Past Medical History:  Diagnosis Date  . Environmental allergies   . Headache(784.0)    last one 1 year ago  . Migraines   . PONV (postoperative nausea and vomiting)    Past Surgical History:  Past Surgical History:  Procedure Laterality Date  . ABDOMINAL HYSTERECTOMY  1980's   total  . ANTERIOR CERVICAL DECOMP/DISCECTOMY FUSION  07/29/2011   Procedure: ANTERIOR CERVICAL DECOMPRESSION/DISCECTOMY FUSION 3 LEVEL/HARDWARE REMOVAL;  Surgeon: Ophelia Charter, MD;  Location: [facility];  Service: Neurosurgery;  Laterality: Bilateral;  Cervical four-five, five-six, six-seven anterior cervical decompresion  with fusion, interbody prothesis, plating and bonegraft  . HIP SURGERY  2006   repair post injury  . [facility] SINUS  2005      Social History:  reports that she has quit smoking. Her smoking use included cigarettes. She has a 10.00 pack-year smoking history. She has never used smokeless tobacco. She reports that she drinks alcohol. She reports that she does not use drugs.  Family History: family history includes COPD in her mother.   HOME MEDICATIONS: Current Outpatient Medications on File Prior to Visit  Medication Sig Dispense Refill  . BREO ELLIPTA 100-25 MCG/INH AEPB Inhale 1 puff into the lungs daily.  3  . estradiol (VIVELLE-DOT) 0.1 MG/24HR Place 1 patch onto the skin 2 (two) times a week.    . fluticasone (VERAMYST) 27.5 MCG/SPRAY nasal spray Place 2 sprays into the nose daily as needed. For allergies    . hydrOXYzine (ATARAX/VISTARIL) 25 MG tablet Take 25 mg by mouth as needed.    Marland Kitchen levocetirizine (XYZAL) 5 MG tablet Take 5 mg by mouth every evening.    . montelukast (SINGULAIR) 10 MG tablet Take 10 mg by mouth at bedtime.    Marland Kitchen oxybutynin (DITROPAN) 5 MG tablet Take 5 mg by mouth 2 (two) times daily.  0  . predniSONE (DELTASONE) 5 MG tablet Take 1 tablet (5 mg total) by mouth daily with breakfast. 4 tabs daily  x 2 weeks 3 tabs daily  x 2 weeks 2 tabs  daily  x 2 weeks Then one tab daily 90 tablet 3  . mycophenolate (CELLCEPT) 500 MG tablet Take 2 tablets (1,000 mg total) by mouth 2 (two) times daily. (Patient not taking: Reported on 05/18/2018) 120 tablet 11  . pyridostigmine (MESTINON) 60 MG tablet Take 1 tablet (60 mg total) by mouth 3 (three) times daily. (Patient not taking: Reported on 05/18/2018) 90 tablet 6   No current facility-administered medications on file prior to visit.       REVIEW OF SYSTEMS: A comprehensive ROS was conducted with the patient and is negative except as per HPI and below:  Review of Systems  Constitutional: Negative for fever and weight loss.    HENT: Negative for congestion and sore throat.   Eyes: Positive for double vision. Negative for discharge.  Respiratory: Negative for cough and shortness of breath.   Cardiovascular: Negative for chest pain and palpitations.  Genitourinary: Negative for frequency.  Musculoskeletal: Negative for neck pain.  Neurological: Positive for tremors. Negative for tingling.  Endo/Heme/Allergies: Negative for polydipsia.  Psychiatric/Behavioral: Negative for depression. The patient is nervous/anxious.        OBJECTIVE:  VS: BP 134/84   Pulse 68   Resp 16   Ht 4\' 11"  (1.499 m)   Wt 116 lb (52.6 kg)   SpO2 98%   BMI 23.43 kg/m    Wt Readings from Last 3 Encounters:  05/18/18 116 lb (52.6 kg)  05/03/18 115 lb (52.2 kg)  07/23/11 114 lb 6.7 oz (51.9 kg)     EXAM: General: Pt appears well and is in NAD  Hydration: Well-hydrated with moist mucous membranes and good skin turgor  Eyes: External eye exam shows a stare with lid lag but no exophthalmos.  EOM intact.   Ears, Nose, Throat: Hearing: Grossly intact bilaterally Dental: Good dentition  Throat: Clear without mass, erythema or exudate  Neck: General: Supple without adenopathy. Thyroid: Thyroid size normal.  No goiter or nodules appreciated. No thyroid bruit.  Lungs: Clear with good BS bilat with no rales, rhonchi, or wheezes  Heart: Auscultation: RRR.  Abdomen: Normoactive bowel sounds, soft, nontender, without masses or organomegaly palpable  Extremities: BL LE: No pretibial edema normal ROM and strength.  Skin: Hair: Texture and amount normal with gender appropriate distribution Skin Inspection: Erythematous macules on the trunk Skin Palpation: Skin temperature, texture, and thickness normal to palpation  Neuro: Cranial nerves: II - XII grossly intact  Motor: Normal strength throughout DTRs: 2+ and symmetric in UE without delay in relaxation phase  Mental Status: Judgment, insight: Intact Orientation: Oriented to time, place,  and person Mood and affect: No depression, anxiety, or agitation     DATA REVIEWED:  TSH 0.008 uIU/mL  T4 2.45 ng/dL  T3 6.2 ng/dL    ASSESSMENT/PLAN/RECOMMENDATIONS:   1. Hyperthyroidism:   - Discussed D/D graves' disease vs toxic MNG - She is clinically hypothyroid but biochemically hyperthyroid  Based on her labs from PCP a few weeks ago -Repeat TFT's today , show continued suppression of TSH but normal FT4 and T3.  - Will obtain TRAb for possible Graves' disease. - We discussed with pt the benefits of methimazole in the Tx of hyperthyroidism, as well as the possible side effects/complications of anti-thyroid drug Tx (specifically detailing the rare, but serious side effect of agranulocytosis). She was informed of need for regular thyroid function monitoring while on methimazole to ensure appropriate dosage without over-treatment. As well, we discussed the possible side effects of methimazole including the chance  of rash, the small chance of liver irritation/juandice and the <=1 in 300-400 chance of sudden onset agranulocytosis.  We discussed importance of going to ED promptly (and stopping methimazole) if shewere to develop significant fever with severe sore throat of other evidence of acute infection.     We extensively discussed the various treatment options for hyperthyroidism and Graves disease including ablation therapy with radioactive iodine versus antithyroid drug treatment versus surgical therapy.  We discussed the various possible benefits versus side effects of the various therapies.   At this time, with normalization of FT4 and T3, I would like to hold off on any treatment and repeat labs in 6 weeks.  Pt advised to call for repeat labs should she experience palpitations, weight loss or jittery sensation.   2. Myasthenia Gravis : - Per neurology   3. Need for influenza vaccine: Received today  4. Need for shingles vaccin : pt advised to contact PCP for a prescription    F/u in 6 weeks   Addendum: Labs discussed with patient on 11/21 . Pt expressed understanding.     Signed electronically by: Mack Guise, MD  [facility]  [facility] [address] [place], [place] [address] Phone: [phone] FAX: [phone]   CC: Patient, No Pcp Per No address on file Phone: None Fax: None   Return to Endocrinology clinic as below: Future Appointments  Date Time Provider [place]  05/31/2018  2:00 PM Marcial Pacas, MD [facility] None

## 2018-05-18 NOTE — Telephone Encounter (Signed)
Please call patient, CT of his chest show no sinus pathology, evidence of emphysema, consistent with underlying COPD, chronic glass attenuation nodule in left upper lobe, repeat CT chest in 3 to 6 months recommended,  Aortic atherosclerosis,  IMPRESSION: 1. No evidence of thymoma. 2. Diffuse bronchial wall thickening with mild to moderate centrilobular and paraseptal emphysema; imaging findings suggestive of underlying COPD. 3. Two ground-glass attenuation nodules, largest of which is in the left upper lobe measuring 9 x 12 mm. Non-contrast chest CT at 3-6 months is recommended. If nodules persist, subsequent management will be based upon the most suspicious nodule(s). This recommendation follows the consensus statement: Guidelines for Management of Incidental Pulmonary Nodules Detected on CT Images: From the Fleischner Society 2017; Radiology 2017; 284:228-243. 4. Aortic atherosclerosis, in addition to left anterior descending coronary artery disease. Please note that although the presence of coronary artery calcium documents the presence of coronary artery disease, the severity of this disease and any potential stenosis cannot be assessed on this non-gated CT examination. Assessment for potential risk factor modification, dietary therapy or pharmacologic therapy may be warranted, if clinically indicated. 5. Cholelithiasis.

## 2018-05-18 NOTE — Telephone Encounter (Signed)
Left message requesting a return call.

## 2018-05-19 ENCOUNTER — Encounter: Payer: Self-pay | Admitting: Internal Medicine

## 2018-05-19 ENCOUNTER — Telehealth: Payer: Self-pay | Admitting: *Deleted

## 2018-05-19 DIAGNOSIS — R7989 Other specified abnormal findings of blood chemistry: Secondary | ICD-10-CM | POA: Insufficient documentation

## 2018-05-19 NOTE — Telephone Encounter (Signed)
Left patient a detailed message, with results, on voicemail (ok per DPR).  Provided our number to call back with any questions.  

## 2018-05-19 NOTE — Telephone Encounter (Signed)
-----   Message from Marcial Pacas, MD sent at 05/19/2018  4:31 PM EST ----- Please call patient for normal laboratory result

## 2018-05-19 NOTE — Telephone Encounter (Signed)
Spoke to patient - she is aware of her CT results and verbalized understanding.  Additionally, she tells me that she plans to become established with a PCP within the next few months or less.

## 2018-05-31 ENCOUNTER — Telehealth: Payer: Self-pay | Admitting: Neurology

## 2018-05-31 ENCOUNTER — Encounter: Payer: Self-pay | Admitting: Neurology

## 2018-05-31 ENCOUNTER — Ambulatory Visit (INDEPENDENT_AMBULATORY_CARE_PROVIDER_SITE_OTHER): Payer: BLUE CROSS/BLUE SHIELD | Admitting: Neurology

## 2018-05-31 VITALS — BP 135/82 | HR 81 | Ht 59.0 in | Wt 115.5 lb

## 2018-05-31 DIAGNOSIS — H532 Diplopia: Secondary | ICD-10-CM | POA: Diagnosis not present

## 2018-05-31 DIAGNOSIS — G7000 Myasthenia gravis without (acute) exacerbation: Secondary | ICD-10-CM | POA: Diagnosis not present

## 2018-05-31 LAB — T3: T3, Total: 90 ng/dL (ref 76–181)

## 2018-05-31 LAB — THYROTROPIN RECEPTOR AUTOABS: Thyrotropin Receptor Ab: 23.9 % — ABNORMAL HIGH (ref <=16.0)

## 2018-05-31 NOTE — Patient Instructions (Addendum)
Prednisone 5mg  2 tabs every morning for one week,  Then one tab every morning for one week, then stop  Keep cellcept 500mg  2 tabs twice a day.  Mestinon 60mg  three times a day as needed.

## 2018-05-31 NOTE — Progress Notes (Signed)
PATIENT: Tammy [name] DOB: Feb 15, 1956  Chief Complaint  Patient presents with  . Myasthenia Gravis    She is here with her husband, Tammy [name].  She is currently taking Prednisone 15mg  and is still tapering down the dose per instructions.  She actually started CellCept but has not yet tried Mestinon.  She confused the two prescriptions.  She feels her symptoms are unchanged.      HISTORICAL  Tammy [name] is a 62 years old female, seen in request by her ophthalmologist Dr. Manuella Ghazi, [place] for evaluation of double vision, initial evaluation was on May 03, 2018.  I have reviewed and summarized the referring note from the referring physician dated April 08, 2018, she was evaluated by her ophthalmologist Dr. 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  Without clear trigger event, patient noticed mild blurry vision when she is driving since September 2019, later she was able to discern it was double vision, double vision gets better closing either left or right eye, the position of the double vision changes, if she closed one eye, the double vision might improve, her double vision also varies, more obvious when she gets tired, and at the end of the day.  She had a history of sinus surgery in 0938, which is complicated by periorbital infection, misalignment of her vision, which lasted for 3 to 4 months, during that period of time, she did have binocular double vision,  She denies swallowing difficulty, denies limb muscle weakness, denies chewing difficulty,  Recently she also had weight loss.  TSH was significantly decreased 0.008 with elevated free T36.2, free T4 2.45 normal CBC hemoglobin of 14.4, CMP, LDL was 100, cholesterol was 187  Laboratory evaluation on May 09, 2018 showed positive acetylcholine binding antibody, with titer of 1.62, blocking antibody 40,  UPDATE May 31 2018: I personally reviewed MRI report May 18, 2018 that was normal.  Ct chest in November 2019, no evidence of thymoma, diffuse bronchial wall thickening with mild to moderate centrilobular and paraseptal emphysema, consistent with underlying COPD, groundglass attenuation nodule, largest at the left upper lobe 9 x 12 mm,  She has been taking tapering dose of prednisone, now taking prednisone 5mg  3 tabs a day x 2 weeks, and also cellcept 500mg  2 tabs bid, tolerating it well, with mild side effect of acid reflux,  She has not taking Mestinon yet  REVIEW OF SYSTEMS: Full 14 system review of systems performed and notable only for weight loss, fatigue, trouble swallowing, double vision, shortness of breath, incontinence, difficulty swallowing, anxiety, recent thoughts All other review of systems were negative.  ALLERGIES: Allergies  Allergen Reactions  . Sulfa Antibiotics Hives  . Molds & Smuts     HOME MEDICATIONS: Current Outpatient Medications  Medication Sig Dispense Refill  . BREO ELLIPTA 100-25 MCG/INH AEPB Inhale 1 puff into the lungs daily.  3  . estradiol (VIVELLE-DOT) 0.1 MG/24HR Place 1 patch onto the skin 2 (two) times a week.    . fluticasone (VERAMYST) 27.5 MCG/SPRAY nasal spray Place 2 sprays into the nose daily as needed. For allergies    . hydrOXYzine (ATARAX/VISTARIL) 25 MG tablet Take 25 mg by mouth as needed.    Marland Kitchen levocetirizine (XYZAL) 5 MG tablet Take 5 mg by mouth every evening.    . montelukast (SINGULAIR) 10 MG tablet Take 10 mg by mouth at bedtime.    Marland Kitchen  mycophenolate (CELLCEPT) 500 MG tablet Take 2 tablets (1,000 mg total) by mouth 2 (two) times daily. (Patient not taking: Reported on 05/18/2018) 120 tablet 11  . oxybutynin (DITROPAN) 5 MG tablet Take 5 mg by mouth 2 (two) times daily.  0  . predniSONE (DELTASONE) 5 MG tablet Take 1 tablet (5 mg total) by mouth daily with breakfast. 4 tabs daily  x 2 weeks 3 tabs daily  x 2 weeks 2 tabs daily  x 2  weeks Then one tab daily 90 tablet 3  . pyridostigmine (MESTINON) 60 MG tablet Take 1 tablet (60 mg total) by mouth 3 (three) times daily. (Patient not taking: Reported on 05/18/2018) 90 tablet 6   No current facility-administered medications for this visit.     PAST MEDICAL HISTORY: Past Medical History:  Diagnosis Date  . Environmental allergies   . Headache(784.0)    last one 1 year ago  . Migraines   . PONV (postoperative nausea and vomiting)     PAST SURGICAL HISTORY: Past Surgical History:  Procedure Laterality Date  . ABDOMINAL HYSTERECTOMY  1980's   total  . ANTERIOR CERVICAL DECOMP/DISCECTOMY FUSION  07/29/2011   Procedure: ANTERIOR CERVICAL DECOMPRESSION/DISCECTOMY FUSION 3 LEVEL/HARDWARE REMOVAL;  Surgeon: Ophelia Charter, MD;  Location: [facility];  Service: Neurosurgery;  Laterality: Bilateral;  Cervical four-five, five-six, six-seven anterior cervical decompresion with fusion, interbody prothesis, plating and bonegraft  . HIP SURGERY  2006   repair post injury  . [facility] SINUS  2005    FAMILY HISTORY: Family History  Problem Relation Age of Onset  . COPD Mother   . Anesthesia problems Neg Hx     SOCIAL HISTORY: Social History   Socioeconomic History  . Marital status: Married    Spouse name: Not on file  . Number of children: Not on file  . Years of education: 43  . Highest education level: Bachelor's degree (e.g., BA, AB, BS)  Occupational History  . Not on file  Social Needs  . Financial resource strain: Not on file  . Food insecurity:    Worry: Not on file    Inability: Not on file  . Transportation needs:    Medical: Not on file    Non-medical: Not on file  Tobacco Use  . Smoking status: Former Smoker    Packs/day: 0.50    Years: 20.00    Pack years: 10.00    Types: Cigarettes  . Smokeless tobacco: Never Used  Substance and Sexual Activity  . Alcohol use: Yes    Comment: 6 times a year   . Drug use: Never  . Sexual activity: Not on  file  Lifestyle  . Physical activity:    Days per week: Not on file    Minutes per session: Not on file  . Stress: Not on file  Relationships  . Social connections:    Talks on phone: Not on file    Gets together: Not on file    Attends religious service: Not on file    Active member of club or organization: Not on file    Attends meetings of clubs or organizations: Not on file    Relationship status: Not on file  . Intimate partner violence:    Fear of current or ex partner: Not on file    Emotionally abused: Not on file    Physically abused: Not on file    Forced sexual activity: Not on file  Other Topics Concern  . Not on file  Social History Narrative   Lives at home with husband   Left handed   Caffeine: daily, 2 cups      PHYSICAL EXAM   Vitals:   05/31/18 1326  BP: 135/82  Pulse: 81  Height: 4\' 11"  (1.499 m)    Not recorded      Body mass index is 23.43 kg/m.  PHYSICAL EXAMNIATION:  Gen: NAD, conversant, well nourised, obese, well groomed                     Cardiovascular: Regular rate rhythm, no peripheral edema, warm, nontender. Eyes: Conjunctivae clear without exudates or hemorrhage Neck: Supple, no carotid bruits. Pulmonary: Clear to auscultation bilaterally   NEUROLOGICAL EXAM:  MENTAL STATUS: Speech:    Speech is normal; fluent and spontaneous with normal comprehension.  Cognition:     Orientation to time, place and person     Normal recent and remote memory     Normal Attention span and concentration     Normal Language, naming, repeating,spontaneous speech     Fund of knowledge   CRANIAL NERVES: CN II: Visual fields are full to confrontation.  Pupils are round equal and briskly reactive to light. CN III, IV, VI: extraocular movement are normal. No ptosis. Right superio-exophoria, left inferio-exophoria, CN V: Facial sensation is intact to pinprick in all 3 divisions bilaterally. Corneal responses are intact.  CN VII: Face is symmetric  with normal eye closure and smile. CN VIII: Hearing is normal to rubbing fingers CN IX, X: Palate elevates symmetrically. Phonation is normal. CN XI: Head turning and shoulder shrug are intact CN XII: Tongue is midline with normal movements and no atrophy.  MOTOR: There is no pronator drift of out-stretched arms. Muscle bulk and tone are normal. Muscle strength is normal.  REFLEXES: Reflexes are 2+ and symmetric at the biceps, triceps, knees, and ankles. Plantar responses are flexor.  SENSORY: Intact to light touch, pinprick, positional sensation and vibratory sensation are intact in fingers and toes.  COORDINATION: Rapid alternating movements and fine finger movements are intact. There is no dysmetria on finger-to-nose and heel-knee-shin.    GAIT/STANCE: Posture is normal. Gait is steady with normal steps, base, arm swing, and turning. Heel and toe walking are normal. Tandem gait is normal.  Romberg is absent.   DIAGNOSTIC DATA (LABS, IMAGING, TESTING) - I reviewed patient records, labs, notes, testing and imaging myself where available.   ASSESSMENT AND PLAN  Tammy [name] is a 62 y.o. female   Seropositive ocular myasthenia gravis  Confirmed by positive acetylcholine binding antibody  Complete evaluation with MRI of the brain was normal  CT of the chest no evidence ofthymus pathology  Mestinon 60 mg 3 times a day as needed  Tapering off prednisone  Continue CellCept 500 mg 2 tablets twice a day for right now, laboratory evaluations, CMP CBC,  Newly diagnosis of hyperthyroidism  Endocrinologist evaluation is pending   Marcial Pacas, M.D. Ph.D.  [facility] [address], [place], [place] [address] Ph: [phone] Fax: [phone]  CC: Danice Goltz, MD

## 2018-05-31 NOTE — Telephone Encounter (Signed)
I called the patient - she did not have her lab work drawn while here today.  She lives two hours from our office. She needs a prescription faxed to the facility below.  [facility] in [place] Attention: [place] Ph: [phone] Fax: [phone]   The signed physician orders for CBC w/ Diff and CMP have been faxed and confirmed.  Instructions were included to fax results back to Dr. Rhea Belton attention at [phone].

## 2018-05-31 NOTE — Telephone Encounter (Signed)
RN with [facility] requesting pts lab order be faxed to them at [phone].

## 2018-06-01 ENCOUNTER — Encounter: Payer: Self-pay | Admitting: Internal Medicine

## 2018-06-03 DIAGNOSIS — G7000 Myasthenia gravis without (acute) exacerbation: Secondary | ICD-10-CM | POA: Diagnosis not present

## 2018-06-03 DIAGNOSIS — H532 Diplopia: Secondary | ICD-10-CM | POA: Diagnosis not present

## 2018-06-07 NOTE — Telephone Encounter (Signed)
Labs drawn on 06/03/18:  CBC w/ DIFF: Lymphs (Absoulte):  3.9 (H) **all other values wnl**  CMP: Protein, Total: 5.4 (L) **all other values wnl**

## 2018-06-10 DIAGNOSIS — M6530 Trigger finger, unspecified finger: Secondary | ICD-10-CM | POA: Diagnosis not present

## 2018-06-10 DIAGNOSIS — M720 Palmar fascial fibromatosis [Dupuytren]: Secondary | ICD-10-CM | POA: Diagnosis not present

## 2018-06-15 DIAGNOSIS — M65352 Trigger finger, left little finger: Secondary | ICD-10-CM | POA: Diagnosis not present

## 2018-06-15 DIAGNOSIS — M65322 Trigger finger, left index finger: Secondary | ICD-10-CM | POA: Diagnosis not present

## 2018-06-15 DIAGNOSIS — M65312 Trigger thumb, left thumb: Secondary | ICD-10-CM | POA: Diagnosis not present

## 2018-06-16 DIAGNOSIS — H469 Unspecified optic neuritis: Secondary | ICD-10-CM | POA: Diagnosis not present

## 2018-06-24 ENCOUNTER — Encounter: Payer: Self-pay | Admitting: Internal Medicine

## 2018-06-24 ENCOUNTER — Ambulatory Visit (INDEPENDENT_AMBULATORY_CARE_PROVIDER_SITE_OTHER): Payer: BLUE CROSS/BLUE SHIELD | Admitting: Internal Medicine

## 2018-06-24 VITALS — BP 102/68 | HR 76 | Ht 59.0 in | Wt 121.0 lb

## 2018-06-24 DIAGNOSIS — E0590 Thyrotoxicosis, unspecified without thyrotoxic crisis or storm: Secondary | ICD-10-CM | POA: Diagnosis not present

## 2018-06-24 DIAGNOSIS — E0500 Thyrotoxicosis with diffuse goiter without thyrotoxic crisis or storm: Secondary | ICD-10-CM | POA: Diagnosis not present

## 2018-06-24 LAB — T4, FREE: Free T4: 0.66 ng/dL (ref 0.60–1.60)

## 2018-06-24 LAB — TSH: TSH: 0.06 u[IU]/mL — ABNORMAL LOW (ref 0.35–4.50)

## 2018-06-24 NOTE — Patient Instructions (Signed)
Please contact us with any symptoms of weight loss, diarrhea, jittery sensation of palpitations.

## 2018-06-24 NOTE — Progress Notes (Signed)
Name: Tammy [name]  MRN/ DOB: [id number], Mar 26, 1956    Age/ Sex: 62 y.o., female     PCP: Patient, No Pcp Per   Reason for Endocrinology Evaluation: Hyperthyroidism     Initial Endocrinology Clinic Visit: 05/18/18    PATIENT IDENTIFIER: [name] is a 62 y.o., female with a past medical history ofMyasthenia Gravis  . She has followed with [facility] since 05/18/18 for consultative assistance with management of her low TSH .   HISTORICAL SUMMARY:  Pt was found to have hyperthyroidism on routine labs, during annual check up. with a TSH of 0.008 uIU/mL and elevated T4 of 2.45 ng/dL.   In November, 2019 she was diagnosed with MG, she is currently on Cellcept and Mestinon, Prednisone stopped in December, 2019 (took it for ~ 6 weeks).    SUBJECTIVE:   During last visit (05/18/18): Repeat labs shows normalization of FT4 and T3 but continued TSH suppression. We decided to monitor at the time and not start her on any anti-thyroid medications.   Today (06/24/2018):  Tammy [name] is here for 6 weeks follow up on her graves's disease.  She has gained 5 lbs since her last visit here, she denies any constipation or diarrhea. Has cold intolerance. Endorses mild fatigue.  Denies palpitations or sob Denies anxiety or jittery sensation.  Her left eye is itchy, saw ophthalmologist and using a lubricant.    ROS:  As per HPI.   HISTORY:  Past Medical History:  Past Medical History:  Diagnosis Date  . Environmental allergies   . Headache(784.0)    last one 1 year ago  . Migraines   . PONV (postoperative nausea and vomiting)    Past Surgical History:  Past Surgical History:  Procedure Laterality Date  . ABDOMINAL HYSTERECTOMY  1980's   total  . ANTERIOR CERVICAL DECOMP/DISCECTOMY FUSION  07/29/2011   Procedure: ANTERIOR CERVICAL DECOMPRESSION/DISCECTOMY FUSION 3 LEVEL/HARDWARE REMOVAL;  Surgeon: Ophelia Charter, MD;  Location: [facility];  Service:  Neurosurgery;  Laterality: Bilateral;  Cervical four-five, five-six, six-seven anterior cervical decompresion with fusion, interbody prothesis, plating and bonegraft  . HIP SURGERY  2006   repair post injury  . [facility] SINUS  2005    Social History:  reports that she has quit smoking. Her smoking use included cigarettes. She has a 10.00 pack-year smoking history. She has never used smokeless tobacco. She reports current alcohol use. She reports that she does not use drugs.  Family History: family history includes COPD in her mother.       OBJECTIVE:   PHYSICAL EXAM: VS: BP 102/68 (BP Location: Left Arm, Patient Position: Sitting, Cuff Size: Normal)   Pulse 76   Ht 4\' 11"  (1.499 m)   Wt 121 lb (54.9 kg)   BMI 24.44 kg/m    EXAM: General: Pt appears well and is in NAD  Eyes: External eye exam normal without stare, lid lag or exophthalmos.  EOM intact.    Neck: General: Supple without adenopathy. Thyroid: Thyroid size normal.  No goiter or nodules appreciated. No thyroid bruit.  Lungs: Clear with good BS bilat with no rales, rhonchi, or wheezes  Heart: Auscultation: RRR.  Abdomen: Normoactive bowel sounds, soft, nontender, without masses or organomegaly palpable  Extremities:  BL LE: No pretibial edema normal ROM and strength.  Skin: Hair: Texture and amount normal with gender appropriate distribution Skin Inspection: No rashes. Skin Palpation: Skin temperature, texture, and thickness normal to palpation  Mental Status:  Judgment, insight: Intact Orientation: Oriented to time, place, and person Memory: Intact for recent and remote events Mood and affect: No depression, anxiety, or agitation     DATA REVIEWED:  Results for Tammy [name], Tammy [name] (MRN [id number]) as of 06/27/2018 09:47  Ref. Range 05/18/2018 15:26 06/24/2018 08:25  TSH Latest Ref Range: 0.35 - 4.50 uIU/mL <0.01 (L) 0.06 (L)  Triiodothyronine (T3) Latest Ref Range: 76 - 181 ng/dL 90 106  T4,Free(Direct) Latest Ref  Range: 0.60 - 1.60 ng/dL 1.29 0.66    Results for Tammy [name], Tammy [name] (MRN [id number]) as of 06/24/2018 07:55  Ref. Range 05/18/2018 15:26  Thyrotropin Receptor Ab Latest Ref Range: <=16.0 % 23.9 (H)    ASSESSMENT / PLAN / RECOMMENDATIONS:   1. Subclinical Hyperthyroidism   Plan:  Pt continues to be clinically hypothyroid   Repeat TFT's today shows decreasing FT4 near the low normal end, her TSH continues to be low but this is lagging behind .  Based on the above results, no need for any thionamide therapy at this time.    2. Graves' Disease : No evidence of extra-thyroidal manifestation of graves' disease.     F/u in 3 months  Signed electronically by: Mack Guise, MD  [facility]  [facility] [address] [place], [place] [address] Phone: [phone] FAX: [phone]      CC: Patient, No Pcp Per No address on file Phone: None  Fax: None   Return to Endocrinology clinic as below: Future Appointments  Date Time Provider [place]  09/19/2018  8:30 AM Marcial Pacas, MD [facility] None  09/23/2018  7:50 AM Tacara Hadlock, Melanie Crazier, MD [id number] None

## 2018-06-25 LAB — T3: T3, Total: 106 ng/dL (ref 76–181)

## 2018-06-27 ENCOUNTER — Emergency Department (HOSPITAL_COMMUNITY)
Admission: EM | Admit: 2018-06-27 | Discharge: 2018-06-27 | Disposition: A | Discharge status: Home / Self Care | Payer: BLUE CROSS/BLUE SHIELD | Attending: Emergency Medicine | Admitting: Emergency Medicine

## 2018-06-27 ENCOUNTER — Encounter (HOSPITAL_COMMUNITY): Payer: Self-pay | Admitting: Emergency Medicine

## 2018-06-27 ENCOUNTER — Emergency Department (HOSPITAL_COMMUNITY): Payer: BLUE CROSS/BLUE SHIELD

## 2018-06-27 ENCOUNTER — Encounter: Payer: Self-pay | Admitting: Internal Medicine

## 2018-06-27 DIAGNOSIS — Z87891 Personal history of nicotine dependence: Secondary | ICD-10-CM | POA: Insufficient documentation

## 2018-06-27 DIAGNOSIS — G43911 Migraine, unspecified, intractable, with status migrainosus: Secondary | ICD-10-CM | POA: Diagnosis not present

## 2018-06-27 DIAGNOSIS — Z79899 Other long term (current) drug therapy: Secondary | ICD-10-CM | POA: Insufficient documentation

## 2018-06-27 DIAGNOSIS — R112 Nausea with vomiting, unspecified: Secondary | ICD-10-CM | POA: Diagnosis not present

## 2018-06-27 DIAGNOSIS — R51 Headache: Secondary | ICD-10-CM | POA: Diagnosis not present

## 2018-06-27 LAB — CBC WITH DIFFERENTIAL/PLATELET
Abs Immature Granulocytes: 0.02 10*3/uL (ref 0.00–0.07)
Basophils Absolute: 0 10*3/uL (ref 0.0–0.1)
Basophils Relative: 1 %
Eosinophils Absolute: 0.1 10*3/uL (ref 0.0–0.5)
Eosinophils Relative: 2 %
HCT: 44.7 % (ref 36.0–46.0)
Hemoglobin: 14.3 g/dL (ref 12.0–15.0)
Immature Granulocytes: 0 %
Lymphocytes Relative: 26 %
Lymphs Abs: 1.6 10*3/uL (ref 0.7–4.0)
MCH: 28.8 pg (ref 26.0–34.0)
MCHC: 32 g/dL (ref 30.0–36.0)
MCV: 89.9 fL (ref 80.0–100.0)
MONO ABS: 0.5 10*3/uL (ref 0.1–1.0)
Monocytes Relative: 8 %
NEUTROS ABS: 3.8 10*3/uL (ref 1.7–7.7)
Neutrophils Relative %: 63 %
Platelets: 330 10*3/uL (ref 150–400)
RBC: 4.97 MIL/uL (ref 3.87–5.11)
RDW: 12.9 % (ref 11.5–15.5)
WBC: 6.1 10*3/uL (ref 4.0–10.5)
nRBC: 0 % (ref 0.0–0.2)

## 2018-06-27 LAB — BASIC METABOLIC PANEL
Anion gap: 10 (ref 5–15)
BUN: 8 mg/dL (ref 6–20)
CO2: 23 mmol/L (ref 22–32)
Calcium: 8.9 mg/dL (ref 8.9–10.3)
Chloride: 105 mmol/L (ref 98–111)
Creatinine, Ser: 0.58 mg/dL (ref 0.44–1.00)
GFR calc Af Amer: >60 mL/min (ref >60)
GFR calc non Af Amer: >60 mL/min (ref >60)
Glucose, Bld: 93 mg/dL (ref 70–99)
Potassium: 3.5 mmol/L (ref 3.5–5.1)
Sodium: 138 mmol/L (ref 135–145)

## 2018-06-27 MED ORDER — METOCLOPRAMIDE HCL 5 MG/ML IJ SOLN
10.0000 mg | Freq: Once | INTRAMUSCULAR | Status: AC
  Administered 2018-06-27: 10 mg via INTRAVENOUS
  Filled 2018-06-27: qty 2

## 2018-06-27 MED ORDER — DIPHENHYDRAMINE HCL 50 MG/ML IJ SOLN
25.0000 mg | Freq: Once | INTRAMUSCULAR | Status: AC
  Administered 2018-06-27: 25 mg via INTRAVENOUS
  Filled 2018-06-27: qty 1

## 2018-06-27 MED ORDER — ONDANSETRON HCL 4 MG/2ML IJ SOLN
4.0000 mg | Freq: Once | INTRAMUSCULAR | Status: AC
  Administered 2018-06-27: 4 mg via INTRAVENOUS
  Filled 2018-06-27: qty 2

## 2018-06-27 MED ORDER — SODIUM CHLORIDE 0.9 % IV SOLN: INTRAVENOUS | Status: DC

## 2018-06-27 MED ORDER — DEXAMETHASONE SODIUM PHOSPHATE 10 MG/ML IJ SOLN
10.0000 mg | Freq: Once | INTRAMUSCULAR | Status: AC
  Administered 2018-06-27: 10 mg via INTRAVENOUS
  Filled 2018-06-27: qty 1

## 2018-06-27 MED ORDER — SODIUM CHLORIDE 0.9 % IV BOLUS
1000.0000 mL | Freq: Once | INTRAVENOUS | Status: AC
  Administered 2018-06-27: 1000 mL via INTRAVENOUS

## 2018-06-27 NOTE — ED Provider Notes (Signed)
[facility]ENC[facility] DEPARTMENT Provider Note   CSN: [id number] Arrival date & time: 06/27/18  0808     History   Chief Complaint Chief Complaint  Patient presents with  . Nausea  . Emesis    HPI Tammy [name] is a 62 y.o. female.  Patient with history of myasthenia gravis.  Patient reports headache mostly frontal associated with photophobia also with some nausea and vomiting really not much in the way of diarrhea.  Patient states symptoms onset was yesterday.  Patient has a history of migraines.  This reminds her somewhat of the migraines.  Last migraine was 6 months ago.  Normally her migraines she can take Excedrin Migraine and they go away.  Usually does not need to come in.  Patient denies any significant weakness or trouble swallowing which is typical for myasthenia gravis.  She is currently under treatment for that still has a little bit of the double vision which they say secondary to that.  But overall she feels better.  Patient also with history of hyperthyroidism.  Patient without any upper respiratory infection no fevers.  Did have some chills.  Has had some soreness to the neck and low back but no stiffness at all.  No cough no trouble breathing.     Past Medical History:  Diagnosis Date  . Environmental allergies   . Headache(784.0)    last one 1 year ago  . Migraines   . PONV (postoperative nausea and vomiting)     Patient Active Problem List   Diagnosis Date Noted  . Abnormal TSH 05/19/2018  . Hyperthyroidism 05/03/2018  . Myasthenia gravis (Frank) 05/03/2018  . Diplopia 05/03/2018    Past Surgical History:  Procedure Laterality Date  . ABDOMINAL HYSTERECTOMY  1980's   total  . ANTERIOR CERVICAL DECOMP/DISCECTOMY FUSION  07/29/2011   Procedure: ANTERIOR CERVICAL DECOMPRESSION/DISCECTOMY FUSION 3 LEVEL/HARDWARE REMOVAL;  Surgeon: Ophelia Charter, MD;  Location: [facility];  Service: Neurosurgery;  Laterality: Bilateral;  Cervical  four-five, five-six, six-seven anterior cervical decompresion with fusion, interbody prothesis, plating and bonegraft  . HIP SURGERY  2006   repair post injury  . [facility] SINUS  2005     OB History   No obstetric history on file.      Home Medications    Prior to Admission medications   Medication Sig Start Date End Date Taking? Authorizing Provider  acetaminophen (TYLENOL) 500 MG tablet Take 1,000 mg by mouth every 6 (six) hours as needed for headache.   Yes [provider]  anti-nausea (EMETROL) solution Take 10 mLs by mouth every 15 (fifteen) minutes as needed for nausea or vomiting.   Yes [provider]  BREO ELLIPTA 100-25 MCG/INH AEPB Inhale 1 puff into the lungs daily. 04/08/18  Yes [provider]  estradiol (VIVELLE-DOT) 0.1 MG/24HR Place 1 patch onto the skin 2 (two) times a week.   Yes [provider]  fluticasone (VERAMYST) 27.5 MCG/SPRAY nasal spray Place 2 sprays into the nose daily as needed. For allergies   Yes [provider]  hydrOXYzine (ATARAX/VISTARIL) 25 MG tablet Take 25 mg by mouth as needed for itching.    Yes [provider]  levocetirizine (XYZAL) 5 MG tablet Take 5 mg by mouth every evening.   Yes [provider]  montelukast (SINGULAIR) 10 MG tablet Take 10 mg by mouth at bedtime.   Yes [provider]  mycophenolate (CELLCEPT) 500 MG tablet Take 2 tablets (1,000 mg total)  by mouth 2 (two) times daily. 05/16/18  Yes Marcial Pacas, MD  oxybutynin (DITROPAN) 5 MG tablet Take 5 mg by mouth 2 (two) times daily. 04/15/18  Yes [provider]  Polyethyl Glycol-Propyl Glycol (SYSTANE) 0.4-0.3 % GEL ophthalmic gel Place 1 application into both eyes 3 (three) times daily.   Yes [provider]  promethazine (PHENERGAN) 25 MG tablet Take 25 mg by mouth every 6 (six) hours as needed for nausea or vomiting.   Yes [provider]  pseudoephedrine-acetaminophen (TYLENOL SINUS)  30-500 MG TABS tablet Take 2 tablets by mouth every 4 (four) hours as needed (Headache).   Yes [provider]  pyridostigmine (MESTINON) 60 MG tablet Take 1 tablet (60 mg total) by mouth 3 (three) times daily. 05/03/18  Yes Marcial Pacas, MD    Family History Family History  Problem Relation Age of Onset  . COPD Mother   . Anesthesia problems Neg Hx     Social History Social History   Tobacco Use  . Smoking status: Former Smoker    Packs/day: 0.50    Years: 20.00    Pack years: 10.00    Types: Cigarettes  . Smokeless tobacco: Never Used  Substance Use Topics  . Alcohol use: Yes    Comment: 6 times a year   . Drug use: Never     Allergies   Sulfa antibiotics and Molds & smuts   Review of Systems Review of Systems  Constitutional: Positive for chills. Negative for fever.  HENT: Negative for congestion.   Eyes: Positive for photophobia and visual disturbance.  Respiratory: Negative for shortness of breath.   Cardiovascular: Negative for chest pain.  Gastrointestinal: Positive for nausea and vomiting. Negative for abdominal pain.  Genitourinary: Negative for dysuria.  Musculoskeletal: Negative for neck stiffness.  Skin: Negative for rash.  Neurological: Positive for headaches. Negative for dizziness, speech difficulty, weakness and numbness.  Hematological: Does not bruise/bleed easily.  Psychiatric/Behavioral: Negative for confusion.     Physical Exam Updated Vital Signs BP 134/80   Pulse 66   Temp 97.7 F (36.5 C) (Oral)   Resp 18   SpO2 97%   Physical Exam Vitals signs and nursing note reviewed.  Constitutional:      General: She is not in acute distress.    Appearance: Normal appearance.  HENT:     Head: Normocephalic and atraumatic.     Mouth/Throat:     Mouth: Mucous membranes are moist.  Eyes:     Extraocular Movements: Extraocular movements intact.     Conjunctiva/sclera: Conjunctivae normal.     Pupils: Pupils are equal, round, and  reactive to light.  Neck:     Musculoskeletal: Normal range of motion and neck supple. No neck rigidity or muscular tenderness.  Cardiovascular:     Rate and Rhythm: Normal rate and regular rhythm.     Pulses: Normal pulses.  Pulmonary:     Effort: No respiratory distress.     Breath sounds: Normal breath sounds.  Abdominal:     Palpations: Abdomen is soft.     Tenderness: There is no abdominal tenderness.  Musculoskeletal: Normal range of motion.        General: No swelling.  Skin:    General: Skin is warm.     Findings: No rash.  Neurological:     Mental Status: She is alert.     Cranial Nerves: Cranial nerve deficit present.     Sensory: No sensory deficit.     Motor:  No weakness.      ED Treatments / Results  Labs (all labs ordered are listed, but only abnormal results are displayed) Labs Reviewed  CBC WITH DIFFERENTIAL/PLATELET  BASIC METABOLIC PANEL    EKG None  Radiology No results found.  Procedures Procedures (including critical care time)  Medications Ordered in ED Medications  0.9 %  sodium chloride infusion (has no administration in time range)  sodium chloride 0.9 % bolus 1,000 mL (has no administration in time range)  diphenhydrAMINE (BENADRYL) injection 25 mg (has no administration in time range)  dexamethasone (DECADRON) injection 10 mg (has no administration in time range)  metoCLOPramide (REGLAN) injection 10 mg (has no administration in time range)  ondansetron (ZOFRAN) injection 4 mg (has no administration in time range)     Initial Impression / Assessment and Plan / ED Course  I have reviewed the triage vital signs and the nursing notes.  Pertinent labs & imaging results that were available during my care of the patient were reviewed by me and considered in my medical decision making (see chart for details).    Patient symptoms seem to be consistent with migraine.  Patient treated with migraine cocktail Decadron Reglan Benadryl and 1 L  of IV fluids.  Patient feeling much better.  Head CT was negative labs normal no leukocytosis no lab electrolyte abnormalities.  Patient stable for discharge home.  Medications given here today should have no interactions with her myasthenia gravis.   Final Clinical Impressions(s) / ED Diagnoses   Final diagnoses:  None    ED Discharge Orders    None       Fredia Sorrow, MD 06/27/18 1124

## 2018-06-27 NOTE — ED Triage Notes (Signed)
Pt here n/v/d since Friday , pt also c/o headache , pt been taking nausea meds but has run out

## 2018-06-27 NOTE — ED Notes (Signed)
Patient verbalizes understanding of discharge instructions. Opportunity for questioning and answers were provided. Armband removed by staff, pt discharged from ED. Ambulated out into lobby

## 2018-06-27 NOTE — Discharge Instructions (Addendum)
Work-up consistent with migraine headache.  Head CT without any acute findings.  Go home and rest in a dark room.  Medications provided today should abate the headache by tomorrow.  Return for any new or worse symptoms.  Follow-up with your doctors as needed.

## 2018-07-05 ENCOUNTER — Telehealth: Payer: Self-pay | Admitting: Neurology

## 2018-07-05 NOTE — Telephone Encounter (Addendum)
Returned call to the patient.  She had two concerns with her Myasthenia Gravis and use of other medications:  1) She occasionally has migraine and Excedrin Migraine works well for her.  She wanted to make sure it was safe to take it.  2) She dislocated her right shoulder.  She has been to the chiropractor and experienced some relief of pain.  She would like to take Aleve or Advil, as needed, for the inflammation.  Also, she may end up going to the doctor and wanted to know if an oral muscle relaxer would be appropriate.  Per vo by Dr. Felecia Shelling, it is okay for the patient to use OTC NSAIDS with her MG.  Additionally, he felt a low dose cyclobenazprine would be fine to take for her injury, if the physician who evaluates it feels she needs an oral muscle relaxer.  The patient is aware of this information and verbalized understanding.

## 2018-07-05 NOTE — Telephone Encounter (Signed)
Pt is asking RN to call her she would like to know what type of Anti inflammatory she can take.  Pt would also like to know if she can take Excedrin Migraine, please call

## 2018-08-22 ENCOUNTER — Telehealth: Payer: Self-pay | Admitting: Internal Medicine

## 2018-08-22 DIAGNOSIS — E0500 Thyrotoxicosis with diffuse goiter without thyrotoxic crisis or storm: Secondary | ICD-10-CM

## 2018-08-22 NOTE — Telephone Encounter (Signed)
Is there anything you would like to address regarding this pt before I call her?

## 2018-08-22 NOTE — Telephone Encounter (Signed)
Called pt and left detailed voicemail with MD message. 

## 2018-08-22 NOTE — Telephone Encounter (Signed)
Patient would like a call back from the nurse  She stated that her hair is falling out and her throat is feeling funny  Please advise

## 2018-08-23 ENCOUNTER — Telehealth: Payer: Self-pay | Admitting: Internal Medicine

## 2018-08-23 ENCOUNTER — Other Ambulatory Visit: Payer: Self-pay

## 2018-08-23 DIAGNOSIS — E0500 Thyrotoxicosis with diffuse goiter without thyrotoxic crisis or storm: Secondary | ICD-10-CM

## 2018-08-23 NOTE — Telephone Encounter (Signed)
Called and spoke with pt regarding labs, and she stated that she can get her labs done by [facility] and labs were ordered so the pt can get them done at her convenience at [facility].

## 2018-08-23 NOTE — Addendum Note (Signed)
Addended by: Kaylyn Lim I on: 08/23/2018 02:39 PM   Modules accepted: Orders

## 2018-08-23 NOTE — Telephone Encounter (Signed)
Patient called and would like her lab work to be done at a facility closer to her.  Patient would like a call back on [phone] in order to arrange outside labs

## 2018-08-24 ENCOUNTER — Telehealth: Payer: Self-pay | Admitting: Internal Medicine

## 2018-08-24 DIAGNOSIS — E0500 Thyrotoxicosis with diffuse goiter without thyrotoxic crisis or storm: Secondary | ICD-10-CM

## 2018-08-24 LAB — TSH: TSH: <0.006 u[IU]/mL — ABNORMAL LOW (ref 0.450–4.500)

## 2018-08-24 LAB — T4, FREE: Free T4: 2.99 ng/dL — ABNORMAL HIGH (ref 0.82–1.77)

## 2018-08-24 MED ORDER — METHIMAZOLE 10 MG PO TABS: 20.0000 mg | ORAL_TABLET | Freq: Every day | ORAL | 3 refills | Status: DC

## 2018-08-24 NOTE — Telephone Encounter (Signed)
Discussed lab results with the patient on 08/24/2018 at 8:20 AM   Results for Tammy [name], Tammy [name] (MRN [id number]) as of 08/24/2018 08:18  Ref. Range 08/23/2018 14:42  TSH Latest Ref Range: 0.450 - 4.500 uIU/mL <0.006 (L)  T4,Free(Direct) Latest Ref Range: 0.82 - 1.77 ng/dL 2.99 (H)    Patient is clinically and biochemically hyperthyroid    Recommendations  Start methimazole 20 mg daily We discussed that Graves' Disease is a result of an autoimmune condition involving the thyroid.    We discussed with pt the benefits of methimazole in the Tx of hyperthyroidism, as well as the possible side effects/complications of anti-thyroid drug Tx (specifically detailing the rare, but serious side effect of agranulocytosis). She was informed of need for regular thyroid function monitoring while on methimazole to ensure appropriate dosage without over-treatment. As well, we discussed the possible side effects of methimazole including the chance of rash, the small chance of liver irritation/juandice and the <=1 in 300-400 chance of sudden onset agranulocytosis.  We discussed importance of going to ED promptly (and stopping methimazole) if shewere to develop significant fever with severe sore throat of other evidence of acute infection.      Recheck TFTs in 3 weeks, lab orders for [facility] was entered into the system.  Patient expressed understanding  Abby Nena Jordan, MD  [facility] Endocrinology  [facility] [address] [place], [place] [address] Phone: [phone] FAX: [phone]

## 2018-09-12 ENCOUNTER — Telehealth: Payer: Self-pay | Admitting: Neurology

## 2018-09-12 ENCOUNTER — Telehealth: Payer: Self-pay | Admitting: Internal Medicine

## 2018-09-12 NOTE — Telephone Encounter (Signed)
Patient called in and stated she would like to try some vitamin supplements but would like to speak with someone before she tried it

## 2018-09-12 NOTE — Telephone Encounter (Signed)
Please advise 

## 2018-09-12 NOTE — Telephone Encounter (Signed)
Patient requests to be called at ph# [phone] re: vitamins that may help with patient's skin itching. Very painful itching.

## 2018-09-12 NOTE — Telephone Encounter (Signed)
Patient mentioned that she would like to try Nsorb, provite and brain reform. She stated she would fax over the information over to pod 2 at [phone]

## 2018-09-13 DIAGNOSIS — R21 Rash and other nonspecific skin eruption: Secondary | ICD-10-CM | POA: Diagnosis not present

## 2018-09-13 NOTE — Telephone Encounter (Signed)
She wanted to know what vitamins would help with itchy skin,I informed her that you stated that she should check with her PCP, she then asked if anything would  cause any interactions with her current medications and I informed her that if/once her PCP placed her on anything to call us back and we can go form there

## 2018-09-13 NOTE — Telephone Encounter (Signed)
Pt stated that she was concerned with medical interactions with any vitamins, I asked pt to call us back if PCP put her on any thing and that I would then have you advise her.

## 2018-09-13 NOTE — Telephone Encounter (Signed)
I guess I am not sure what her question is ? Is she asking if she could take things to help with dry skin ?   I am a little unclear of what the question is.

## 2018-09-15 ENCOUNTER — Telehealth (INDEPENDENT_AMBULATORY_CARE_PROVIDER_SITE_OTHER): Payer: BLUE CROSS/BLUE SHIELD | Admitting: Neurology

## 2018-09-15 DIAGNOSIS — E0590 Thyrotoxicosis, unspecified without thyrotoxic crisis or storm: Secondary | ICD-10-CM

## 2018-09-15 DIAGNOSIS — G7000 Myasthenia gravis without (acute) exacerbation: Secondary | ICD-10-CM

## 2018-09-15 MED ORDER — GABAPENTIN 300 MG PO CAPS: 300.0000 mg | ORAL_CAPSULE | Freq: Three times a day (TID) | ORAL | 6 refills | Status: DC

## 2018-09-15 NOTE — Telephone Encounter (Signed)
Chief Complains/Reason for telephone visit: Follow-up visit Names of all people present and their role: Patient Relevant History: Tammy [name] is a 63 years old female, seen in request by her ophthalmologist Dr. Manuella Ghazi, Tammy [name] for evaluation of double vision, initial evaluation was on May 03, 2018.  I have reviewed and summarized the referring note from the referring physician dated April 08, 2018, she was evaluated by her ophthalmologist Dr. 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  Without clear trigger event, patient noticed mild blurry vision when she is driving since September 2019, later she was able to discern it was double vision, double vision gets better closing either left or right eye, the position of the double vision changes, if she closed one eye, the double vision might improve, her double vision also varies, more obvious when she gets tired, and at the end of the day.  She had a history of sinus surgery in 9449, which is complicated by periorbital infection, misalignment of her vision, which lasted for 3 to 4 months, during that period of time, she did have binocular double vision,  She denies swallowing difficulty, denies limb muscle weakness, denies chewing difficulty,  Recently she also had weight loss.  TSH was significantly decreased 0.008 with elevated free T36.2, free T4 2.45 normal CBC hemoglobin of 14.4, CMP, LDL was 100, cholesterol was 187  Laboratory evaluation on May 09, 2018 showed positive acetylcholine binding antibody, with titer of 1.62, blocking antibody 40,  UPDATE May 31 2018: I personally reviewed MRI report May 18, 2018 that was normal.  Ct chest in November 2019, no evidence of thymoma, diffuse bronchial wall thickening with mild to moderate centrilobular and paraseptal emphysema, consistent with underlying COPD, groundglass  attenuation nodule, largest at the left upper lobe 9 x 12 mm,  She has been taking tapering dose of prednisone, now taking prednisone 5mg  3 tabs a day x 2 weeks, and also cellcept 500mg  2 tabs bid, tolerating it well, with mild side effect of acid reflux,  She has not taking Mestinon yet  Telephone Visit On September 15 2018: She is now treated for hyperthyroidism with methimazole, 10 mg 2 tablets twice a day, repeat laboratory evaluation on August 23, 2018 showed elevated free T4 2.99, decreased TSH less than 0.006,  She also complains of skin itching, which has been a chronic condition for many years, had a significant improvement while she was taking prednisone for myasthenia gravis, she was seen by her primary care physician, was started on prednisone 20 mg twice daily for 2 weeks,  She continue have intermittent double visions, especially looking to the right side, difficulty focusing with rapid eye movement, she has been taking CellCept 500 mg 2 tablets twice a day, Mestinon 60 mg 3 times daily, her swallowing difficulty shortness of breath has much improved, but with exertion, she still has mild difficulty breathing which is not her baseline.  Results Reviewed: Laboratory evaluations  Assessment and Plan: Tammy [name] is a 63 y.o. female   Seropositive ocular myasthenia gravis             Confirmed by positive acetylcholine binding antibody             MRI of the brain was normal             CT of the chest no evidence of thymus pathology  Mestinon 60 mg 3 times a day as needed     Continue CellCept 500 mg 2 tablets twice a day Hyperthyroidism   Under management of endocrinologist evaluation is pending  Documentation of Time of Medical Discussion:  Total time is 16 minutes

## 2018-09-16 ENCOUNTER — Telehealth: Payer: Self-pay | Admitting: *Deleted

## 2018-09-16 NOTE — Telephone Encounter (Signed)
Called and spoke with pt. Advised appt on 09/19/18 cx d/t concerns with coronavirus. She spoke with Dr. Krista Blue on the phone yesterday. Advised we will f/u Monday about getting her appt r/s. She verbalized understanding and appreciation.

## 2018-09-19 ENCOUNTER — Telehealth (INDEPENDENT_AMBULATORY_CARE_PROVIDER_SITE_OTHER): Payer: Self-pay | Admitting: Neurology

## 2018-09-19 ENCOUNTER — Ambulatory Visit: Payer: BLUE CROSS/BLUE SHIELD | Admitting: Neurology

## 2018-09-19 ENCOUNTER — Other Ambulatory Visit: Payer: Self-pay

## 2018-09-19 DIAGNOSIS — Z0289 Encounter for other administrative examinations: Secondary | ICD-10-CM

## 2018-09-23 ENCOUNTER — Ambulatory Visit: Payer: BLUE CROSS/BLUE SHIELD | Admitting: Internal Medicine

## 2018-09-29 NOTE — Progress Notes (Addendum)
Virtual Visit via Video Note  I connected with Tammy [name] 09/30/18 at 11:10 AM EDT by a video enabled telemedicine application and verified that I am speaking with the correct person using two identifiers.   I discussed the limitations of evaluation and management by telemedicine and the availability of in person appointments. The patient expressed understanding and agreed to proceed.  -Location of the patient : Home  -Location of the provider : Office  -The names of all persons participating in the telemedicine service : [place]      Name: Tammy [name]  MRN/ DOB: [id number], [birth year]    Age/ Sex: 63 y.o., female    PCP: Patient, No Pcp Per   Reason for Endocrinology Evaluation: Hyperthyroidism      Date of Initial Endocrinology Evaluation: 05/18/2018    HPI: Tammy [name] is a 63 y.o. female with a past medical history of Myasthenia gravis and hyperthyroidism . The patient presented for initial endocrinology clinic visit on 05/18/2018 for consultative assistance with her Hyperthyroidism.   HISTORICAL SUMMARY:  Pt was found to have hyperthyroidism on routine labs during annual check up in November, 2019, with a TSH of 0.008 uIU/mL and elevated T4 of 2.45 ng/dL. She was also diagnosed with Myasthenia gravis at the same time and was started on Cellcept.   Upon initial presentation, her FT4 has normalized and was not started on Thionamides.    By February, 2020 she called with c/o hair loss and fatigue, TSH was suppressed with elevated FT4 at 2.99 ng/dL and was started on Methimazole 20 mg daily.   Sister with thyroid disease and RA    SUBJECTIVE:     Today (09/30/2018):  Tammy [name] is here for a 6 week virtual visit for a  follow up on her graves's disease. Today she is continuing to have double vision, this is though to be related to her graves' disease as MG is under control, she denies foreign sensation or eye discharge.  She has noted weight  gain, cold intolerance and hyperphagia at night.  She is currently on gabapentin for her pruritus and skin sensitivity.   She denies any local neck symptoms.   No side effects to methimazole  Pt did have TFT's drawn on 3/9 but we never got those results.    HISTORY:  Past Medical History:  Past Medical History:  Diagnosis Date  . Environmental allergies   . Headache(784.0)    last one 1 year ago  . Migraines   . PONV (postoperative nausea and vomiting)    Past Surgical History:  Past Surgical History:  Procedure Laterality Date  . ABDOMINAL HYSTERECTOMY  1980's   total  . ANTERIOR CERVICAL DECOMP/DISCECTOMY FUSION  07/29/2011   Procedure: ANTERIOR CERVICAL DECOMPRESSION/DISCECTOMY FUSION 3 LEVEL/HARDWARE REMOVAL;  Surgeon: Ophelia Charter, MD;  Location: [facility];  Service: Neurosurgery;  Laterality: Bilateral;  Cervical four-five, five-six, six-seven anterior cervical decompresion with fusion, interbody prothesis, plating and bonegraft  . HIP SURGERY  2006   repair post injury  . [facility] SINUS  2005      Social History:  reports that she has quit smoking. Her smoking use included cigarettes. She has a 10.00 pack-year smoking history. She has never used smokeless tobacco. She reports current alcohol use. She reports that she does not use drugs.  Family History: family history includes COPD in her mother.   HOME MEDICATIONS: Allergies as of 09/30/2018      Reactions  Sulfa Antibiotics Hives   Molds & Smuts       Medication List       Accurate as of September 30, 2018 10:02 AM. Always use your most recent med list.        acetaminophen 500 MG tablet Commonly known as:  TYLENOL Take 1,000 mg by mouth every 6 (six) hours as needed for headache.   anti-nausea solution Take 10 mLs by mouth every 15 (fifteen) minutes as needed for nausea or vomiting.   Breo Ellipta 100-25 MCG/INH Aepb Generic drug:  fluticasone furoate-vilanterol Inhale 1 puff into the lungs daily.    estradiol 0.1 MG/24HR patch Commonly known as:  VIVELLE-DOT Place 1 patch onto the skin 2 (two) times a week.   fluticasone 27.5 MCG/SPRAY nasal spray Commonly known as:  VERAMYST Place 2 sprays into the nose daily as needed. For allergies   gabapentin 300 MG capsule Commonly known as:  NEURONTIN Take 1 capsule (300 mg total) by mouth 3 (three) times daily.   HYDROcodone-acetaminophen 5-325 MG tablet Commonly known as:  NORCO/VICODIN Take 1 tablet by mouth every 6 (six) hours as needed for moderate pain.   hydrOXYzine 25 MG tablet Commonly known as:  ATARAX/VISTARIL Take 25 mg by mouth as needed for itching.   levocetirizine 5 MG tablet Commonly known as:  XYZAL Take 5 mg by mouth every evening.   methimazole 10 MG tablet Commonly known as:  TAPAZOLE Take 2 tablets (20 mg total) by mouth daily.   montelukast 10 MG tablet Commonly known as:  SINGULAIR Take 10 mg by mouth at bedtime.   mycophenolate 500 MG tablet Commonly known as:  CELLCEPT Take 2 tablets (1,000 mg total) by mouth 2 (two) times daily.   promethazine 25 MG tablet Commonly known as:  PHENERGAN Take 25 mg by mouth every 6 (six) hours as needed for nausea or vomiting.   pseudoephedrine-acetaminophen 30-500 MG Tabs tablet Commonly known as:  TYLENOL SINUS Take 2 tablets by mouth every 4 (four) hours as needed (Headache).   pyridostigmine 60 MG tablet Commonly known as:  Mestinon Take 1 tablet (60 mg total) by mouth 3 (three) times daily.   Systane 0.4-0.3 % Gel ophthalmic gel Generic drug:  Polyethyl Glycol-Propyl Glycol Place 1 application into both eyes 3 (three) times daily.         REVIEW OF SYSTEMS: A comprehensive ROS was conducted with the patient and is negative except as per HPI and below:  Review of Systems  Constitutional: Negative for fever and weight loss.  HENT: Negative for congestion and sore throat.   Respiratory: Negative for cough and shortness of breath.   Cardiovascular:  Negative for chest pain and palpitations.  Gastrointestinal: Negative for constipation, diarrhea and nausea.  Skin: Positive for itching.     DATA REVIEWED: Results for Tammy, [name] (MRN [id number]) as of 09/30/2018 09:57  Ref. Range 08/23/2018 14:42  TSH Latest Ref Range: 0.450 - 4.500 uIU/mL <0.006 (L)  T4,Free(Direct) Latest Ref Range: 0.82 - 1.77 ng/dL 2.99 (H)     Results for RAELEEN, WINSTANLEY (MRN [id number]) as of 09/30/2018 09:57  Ref. Range 05/18/2018 15:26  Thyrotropin Receptor Ab Latest Ref Range: <=16.0 % 23.9 (H)    ASSESSMENT/PLAN/RECOMMENDATIONS:   1. Hyperthyroidism Secondary to Graves' Disease:  - Clinically she is having some hypothyroid symptoms.  - Unfortunately we never received her labs from [facility] that were drawn on 09/05/2018 - Pt would rather have her labs drawn at her PCP's office from  now on.  - We called [facility] and there's no records of labs drawn on 3/9 , will ask the patient to have sooner TFT checks.    Medications : Methimazole 20 mg daily       2. 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 Pt advised to see her ophthalmologist as thyroid disease secondary to graves and graves' orbitopathy are a separate entity and needs to be treated septately. Controlling her thyroid function does not guarantee controlling graves' orbitopathy.     I discussed the assessment and treatment plan with the patient. The patient was provided an opportunity to ask questions and all were answered. The patient agreed with the plan and demonstrated an understanding of the instructions.   The patient was advised to call back or seek an in-person evaluation if the symptoms worsen or if the condition fails to improve as anticipated.     Signed electronically by: Mack Guise, MD  [facility] Endocrinology  [facility] [address] [place], [place] [address] Phone:  [phone] FAX: [phone]   CC:  [facility] [address] [place], [place] [address]  Phone: [phone] Fax: [phone]   Return to Endocrinology clinic as below: Future Appointments  Date Time Provider [place]  09/30/2018 11:10 AM Shamleffer, Melanie Crazier, MD [facility] None

## 2018-09-30 ENCOUNTER — Other Ambulatory Visit: Payer: Self-pay

## 2018-09-30 ENCOUNTER — Encounter: Payer: BLUE CROSS/BLUE SHIELD | Admitting: Internal Medicine

## 2018-09-30 ENCOUNTER — Encounter: Payer: Self-pay | Admitting: Internal Medicine

## 2018-09-30 ENCOUNTER — Telehealth: Payer: Self-pay | Admitting: Internal Medicine

## 2018-09-30 DIAGNOSIS — E0500 Thyrotoxicosis with diffuse goiter without thyrotoxic crisis or storm: Secondary | ICD-10-CM

## 2018-09-30 DIAGNOSIS — E0590 Thyrotoxicosis, unspecified without thyrotoxic crisis or storm: Secondary | ICD-10-CM

## 2018-09-30 NOTE — Telephone Encounter (Signed)
Tammy [name], please let the patient know  That penny called [facility] and they don't have any lab work on her from 3/9  Please ask her to present to her PCP's office for lab work.   Orders have been faxed to them    Thanks     Abby Nena Jordan, MD  [facility] Endocrinology  [facility] [address] [place], [place] [address] Phone: [phone] FAX: [phone]

## 2018-09-30 NOTE — Addendum Note (Signed)
Addended by: Dorita Sciara on: 09/30/2018 11:52 AM   Modules accepted: Orders, Level of Service

## 2018-09-30 NOTE — Telephone Encounter (Signed)
lft vm for pt 

## 2018-10-04 DIAGNOSIS — E038 Other specified hypothyroidism: Secondary | ICD-10-CM | POA: Diagnosis not present

## 2018-11-11 DIAGNOSIS — G7000 Myasthenia gravis without (acute) exacerbation: Secondary | ICD-10-CM | POA: Diagnosis not present

## 2018-11-29 DIAGNOSIS — E0500 Thyrotoxicosis with diffuse goiter without thyrotoxic crisis or storm: Secondary | ICD-10-CM | POA: Diagnosis not present

## 2018-11-29 DIAGNOSIS — G7000 Myasthenia gravis without (acute) exacerbation: Secondary | ICD-10-CM | POA: Diagnosis not present

## 2018-11-30 ENCOUNTER — Other Ambulatory Visit: Payer: Self-pay

## 2018-11-30 ENCOUNTER — Ambulatory Visit (INDEPENDENT_AMBULATORY_CARE_PROVIDER_SITE_OTHER): Payer: BC Managed Care – PPO | Admitting: Internal Medicine

## 2018-11-30 ENCOUNTER — Encounter: Payer: Self-pay | Admitting: Internal Medicine

## 2018-11-30 VITALS — BP 106/82 | HR 73 | Temp 98.3°F | Ht 59.0 in | Wt 122.0 lb

## 2018-11-30 DIAGNOSIS — E0590 Thyrotoxicosis, unspecified without thyrotoxic crisis or storm: Secondary | ICD-10-CM | POA: Diagnosis not present

## 2018-11-30 DIAGNOSIS — E0500 Thyrotoxicosis with diffuse goiter without thyrotoxic crisis or storm: Secondary | ICD-10-CM | POA: Insufficient documentation

## 2018-11-30 LAB — T4, FREE: Free T4: 0.61 ng/dL (ref 0.60–1.60)

## 2018-11-30 LAB — TSH: TSH: 0.7 u[IU]/mL (ref 0.35–4.50)

## 2018-11-30 NOTE — Patient Instructions (Addendum)
We recommend that you follow these hyperthyroidism instructions at home:  1) Take Methimazole 20 mg once a day  If you develop severe sore throat with high fevers OR develop unexplained yellowing of your skin, eyes, under your tongue, severe abdominal pain with nausea or vomiting --> then please get evaluated immediately.   3) Get repeat thyroid labs in 6 weeks    It is ESSENTIAL to get follow-up labs to help avoid over or undertreatment of your hyperthyroidism - both of which can be dangerous to your health.

## 2018-11-30 NOTE — Progress Notes (Signed)
Name: Tammy [name]  MRN/ DOB: [id number], Nov 10, 1955    Age/ Sex: 63 y.o., female     PCP: Sander Radon, NP   Reason for Endocrinology Evaluation: Hyperthyroidism     Initial Endocrinology Clinic Visit: 05/18/18    PATIENT IDENTIFIER: Tammy [name] is a 63 y.o., female with a past medical history of Myasthenia Gravis  . She has followed with [facility] since 05/18/18 for consultative assistance with management of her low TSH .   HISTORICAL SUMMARY:  Pt was found to have hyperthyroidism on routine labs in 03/2018, with a TSH of 0.008 uIU/mL and elevated T4 of 2.45 ng/dL.    In November, 2019 she was diagnosed with Myasthenia Gravis, she was initially on on Cellcept, Mestinon and prednisone. Prednisone was tapered off fol lowed by mestinon and currently  On cellcept.  By the time of her presentation to our clinic, her FT4 have normalized and TSH was improving so we opted not to start any thionamide therapy at the time .  Methimazole was started in 07/2018 with a TSH of < 0.006 uIU/mL and elevated FT4 at 2.99  SUBJECTIVE:   During last visit (06/24/18): Repeat labs showed normal FT4 at 0.66 ng/dL and T3 but continued TSH suppression at 0.06 uIU/mL . We decided to monitor at the time and not start her on any anti-thyroid medications.   Today (11/30/2018):  Tammy [name] is here for a follow up on her graves's disease. She has not been to our office since December, 2019.  She has been on methimazole since 07/2018.   Her weight has been stable.  She continues to have double vision of the right eye. [facility] thyroid eye [facility] and was told its not related to thyroid . She is scheduled to see neuro-ophthalmologist physician.  She continues with photosensitivity, burning and irritation of the eyes.   Denies palpitations or sob Denies anxiety or jittery sensation.  Denies local neck enlargement.   Denies Gi symptoms.    ROS:  As per HPI.    HISTORY:  Past Medical History:  Past Medical History:  Diagnosis Date  . Environmental allergies   . Headache(784.0)    last one 1 year ago  . Migraines   . PONV (postoperative nausea and vomiting)    Past Surgical History:  Past Surgical History:  Procedure Laterality Date  . ABDOMINAL HYSTERECTOMY  1980's   total  . ANTERIOR CERVICAL DECOMP/DISCECTOMY FUSION  07/29/2011   Procedure: ANTERIOR CERVICAL DECOMPRESSION/DISCECTOMY FUSION 3 LEVEL/HARDWARE REMOVAL;  Surgeon: Ophelia Charter, MD;  Location: [facility];  Service: Neurosurgery;  Laterality: Bilateral;  Cervical four-five, five-six, six-seven anterior cervical decompresion with fusion, interbody prothesis, plating and bonegraft  . HIP SURGERY  2006   repair post injury  . [facility] SINUS  2005    Social History:  reports that she has quit smoking. Her smoking use included cigarettes. She has a 10.00 pack-year smoking history. She has never used smokeless tobacco. She reports current alcohol use. She reports that she does not use drugs.  Family History: family history includes COPD in her mother.       OBJECTIVE:   PHYSICAL EXAM: VS: There were no vitals taken for this visit.   EXAM: General: Pt appears well and is in NAD  Eyes: External eye exam normal without stare, lid lag or exophthalmos.  EOM intact.    Neck: General: Supple without adenopathy. Thyroid: Thyroid size normal.  No goiter  or nodules appreciated. No thyroid bruit.  Lungs: Clear with good BS bilat with no rales, rhonchi, or wheezes  Heart: Auscultation: RRR.  Extremities:  BL LE: No pretibial edema normal ROM and strength.  Skin: Hair: Texture and amount normal with gender appropriate distribution Skin Inspection: No rashes. Skin Palpation: Skin temperature, texture, and thickness normal to palpation  Mental Status: Judgment, insight: Intact Orientation: Oriented to time, place, and person Mood and affect: No depression, anxiety, or agitation      DATA REVIEWED: Results for Tammy, [name] (MRN [id number]) as of 12/02/2018 10:38  Ref. Range 08/23/2018 14:42 11/30/2018 11:45 11/30/2018 11:45  TSH Latest Ref Range: 0.35 - 4.50 uIU/mL <0.006 (L) 0.70 0.69  T4,Free(Direct) Latest Ref Range: 0.60 - 1.60 ng/dL 2.99 (H) 0.61        Results for Tammy, [name] (MRN [id number]) as of 06/24/2018 07:55  Ref. Range 05/18/2018 15:26  Thyrotropin Receptor Ab Latest Ref Range: <=16.0 % 23.9 (H)    ASSESSMENT / PLAN / RECOMMENDATIONS:   1. Hyperthyroidism Secondary to Graves' Disease:    [name]   Denies any local neck symptoms.   Repeat TFT's today show normal TFT's but since the FT4 is on the lower end of normal, will reduce methimazole dose as below  Medications   Methimazole 15 mg daily     2. Graves' Disease :  - Pt continues to have double vision, photosensitivity, and burning of the eyes.     F/u in 3 months  Labs in 6 weeks    Discussed lab results with pt and recommendations on 12/02/2018 at 10:30 am   Signed electronically by: Mack Guise, MD  [facility] Endocrinology  [facility] [address] [place], [place] [address] Phone: [phone] FAX: [phone]      CC: Sander Radon, NP [place] [address] Phone: [phone]  Fax: [phone]   Return to Endocrinology clinic as below: Future Appointments  Date Time Provider [place]  11/30/2018 11:10 AM Shamleffer, Melanie Crazier, MD [facility] None

## 2018-12-01 LAB — TSH: TSH: 0.69 u[IU]/mL (ref 0.35–4.50)

## 2018-12-02 MED ORDER — METHIMAZOLE 10 MG PO TABS: 15.0000 mg | ORAL_TABLET | Freq: Every day | ORAL | 3 refills | Status: DC

## 2018-12-05 DIAGNOSIS — H5021 Vertical strabismus, right eye: Secondary | ICD-10-CM | POA: Diagnosis not present

## 2018-12-05 DIAGNOSIS — G7000 Myasthenia gravis without (acute) exacerbation: Secondary | ICD-10-CM | POA: Diagnosis not present

## 2018-12-05 DIAGNOSIS — E0500 Thyrotoxicosis with diffuse goiter without thyrotoxic crisis or storm: Secondary | ICD-10-CM | POA: Diagnosis not present

## 2018-12-05 DIAGNOSIS — H532 Diplopia: Secondary | ICD-10-CM | POA: Diagnosis not present

## 2018-12-15 DIAGNOSIS — Z1231 Encounter for screening mammogram for malignant neoplasm of breast: Secondary | ICD-10-CM | POA: Diagnosis not present

## 2018-12-15 DIAGNOSIS — Z01419 Encounter for gynecological examination (general) (routine) without abnormal findings: Secondary | ICD-10-CM | POA: Diagnosis not present

## 2018-12-15 DIAGNOSIS — Z6825 Body mass index (BMI) 25.0-25.9, adult: Secondary | ICD-10-CM | POA: Diagnosis not present

## 2018-12-15 DIAGNOSIS — Z1272 Encounter for screening for malignant neoplasm of vagina: Secondary | ICD-10-CM | POA: Diagnosis not present

## 2018-12-15 DIAGNOSIS — Z90710 Acquired absence of both cervix and uterus: Secondary | ICD-10-CM | POA: Diagnosis not present

## 2018-12-20 ENCOUNTER — Telehealth: Payer: Self-pay | Admitting: Internal Medicine

## 2018-12-20 DIAGNOSIS — E0500 Thyrotoxicosis with diffuse goiter without thyrotoxic crisis or storm: Secondary | ICD-10-CM

## 2018-12-20 NOTE — Telephone Encounter (Signed)
Patient called and would like her lab results from earlier in June.    Call back number is [phone]

## 2018-12-21 ENCOUNTER — Other Ambulatory Visit: Payer: Self-pay

## 2018-12-21 ENCOUNTER — Other Ambulatory Visit: Payer: Self-pay | Admitting: Internal Medicine

## 2018-12-21 ENCOUNTER — Other Ambulatory Visit (INDEPENDENT_AMBULATORY_CARE_PROVIDER_SITE_OTHER): Payer: BC Managed Care – PPO

## 2018-12-21 DIAGNOSIS — E0500 Thyrotoxicosis with diffuse goiter without thyrotoxic crisis or storm: Secondary | ICD-10-CM | POA: Diagnosis not present

## 2018-12-21 LAB — TSH: TSH: 3.82 u[IU]/mL (ref 0.35–4.50)

## 2018-12-21 LAB — T4, FREE: Free T4: 0.67 ng/dL (ref 0.60–1.60)

## 2018-12-21 LAB — T3, FREE: T3, Free: 2.8 pg/mL (ref 2.3–4.2)

## 2018-12-21 NOTE — Telephone Encounter (Signed)
Pt stated that she has seen a specialist that stated that she has thyroid eye disease and that they would like to put her on a new mediation called tepezza but pt must have her T3 and T4 levels checked first. Pt had T4 done on 12/01/18 and she wanted to know if you will place order for T3? She is also going to contact the office that she had her visit at and ask them to fax [place] a copy of the visit.

## 2018-12-21 NOTE — Telephone Encounter (Signed)
Pt scheduled for this afternoon.

## 2018-12-22 ENCOUNTER — Encounter: Payer: Self-pay | Admitting: Internal Medicine

## 2018-12-23 ENCOUNTER — Telehealth: Payer: Self-pay | Admitting: Internal Medicine

## 2018-12-23 NOTE — Telephone Encounter (Signed)
Spoke to pt this morning per result note

## 2018-12-23 NOTE — Telephone Encounter (Signed)
Per [place] states she is returning a call"

## 2019-01-09 ENCOUNTER — Other Ambulatory Visit: Payer: BC Managed Care – PPO

## 2019-01-09 DIAGNOSIS — E0500 Thyrotoxicosis with diffuse goiter without thyrotoxic crisis or storm: Secondary | ICD-10-CM | POA: Diagnosis not present

## 2019-01-30 DIAGNOSIS — H1045 Other chronic allergic conjunctivitis: Secondary | ICD-10-CM | POA: Diagnosis not present

## 2019-01-30 DIAGNOSIS — R05 Cough: Secondary | ICD-10-CM | POA: Diagnosis not present

## 2019-01-30 DIAGNOSIS — J019 Acute sinusitis, unspecified: Secondary | ICD-10-CM | POA: Diagnosis not present

## 2019-01-30 DIAGNOSIS — J3089 Other allergic rhinitis: Secondary | ICD-10-CM | POA: Diagnosis not present

## 2019-02-06 DIAGNOSIS — G7000 Myasthenia gravis without (acute) exacerbation: Secondary | ICD-10-CM | POA: Diagnosis not present

## 2019-02-06 DIAGNOSIS — H532 Diplopia: Secondary | ICD-10-CM | POA: Diagnosis not present

## 2019-02-06 DIAGNOSIS — E0500 Thyrotoxicosis with diffuse goiter without thyrotoxic crisis or storm: Secondary | ICD-10-CM | POA: Diagnosis not present

## 2019-02-10 DIAGNOSIS — J0141 Acute recurrent pansinusitis: Secondary | ICD-10-CM | POA: Diagnosis not present

## 2019-02-10 DIAGNOSIS — R05 Cough: Secondary | ICD-10-CM | POA: Diagnosis not present

## 2019-02-22 DIAGNOSIS — H5021 Vertical strabismus, right eye: Secondary | ICD-10-CM | POA: Diagnosis not present

## 2019-02-22 DIAGNOSIS — G7000 Myasthenia gravis without (acute) exacerbation: Secondary | ICD-10-CM | POA: Diagnosis not present

## 2019-02-22 DIAGNOSIS — H532 Diplopia: Secondary | ICD-10-CM | POA: Diagnosis not present

## 2019-02-22 DIAGNOSIS — E0500 Thyrotoxicosis with diffuse goiter without thyrotoxic crisis or storm: Secondary | ICD-10-CM | POA: Diagnosis not present

## 2019-02-23 ENCOUNTER — Telehealth: Payer: Self-pay | Admitting: Internal Medicine

## 2019-02-23 NOTE — Telephone Encounter (Signed)
error 

## 2019-02-24 ENCOUNTER — Other Ambulatory Visit: Payer: Self-pay

## 2019-02-24 ENCOUNTER — Encounter: Payer: BC Managed Care – PPO | Admitting: Internal Medicine

## 2019-02-24 ENCOUNTER — Encounter: Payer: Self-pay | Admitting: Internal Medicine

## 2019-02-24 ENCOUNTER — Telehealth: Payer: Self-pay | Admitting: Internal Medicine

## 2019-02-24 ENCOUNTER — Ambulatory Visit (INDEPENDENT_AMBULATORY_CARE_PROVIDER_SITE_OTHER): Payer: BC Managed Care – PPO | Admitting: Internal Medicine

## 2019-02-24 VITALS — BP 104/68 | HR 61 | Temp 98.1°F | Ht 59.0 in | Wt 116.8 lb

## 2019-02-24 DIAGNOSIS — E0590 Thyrotoxicosis, unspecified without thyrotoxic crisis or storm: Secondary | ICD-10-CM

## 2019-02-24 DIAGNOSIS — R252 Cramp and spasm: Secondary | ICD-10-CM

## 2019-02-24 DIAGNOSIS — E0500 Thyrotoxicosis with diffuse goiter without thyrotoxic crisis or storm: Secondary | ICD-10-CM

## 2019-02-24 LAB — TSH: TSH: 4.06 u[IU]/mL (ref 0.35–4.50)

## 2019-02-24 LAB — BASIC METABOLIC PANEL
BUN: 17 mg/dL (ref 6–23)
CO2: 29 mEq/L (ref 19–32)
Calcium: 8.9 mg/dL (ref 8.4–10.5)
Chloride: 102 mEq/L (ref 96–112)
Creatinine, Ser: 0.78 mg/dL (ref 0.40–1.20)
GFR: 75.11 mL/min (ref >60.00)
Glucose, Bld: 86 mg/dL (ref 70–99)
Potassium: 4 mEq/L (ref 3.5–5.1)
Sodium: 138 mEq/L (ref 135–145)

## 2019-02-24 LAB — T4, FREE: Free T4: 0.76 ng/dL (ref 0.60–1.60)

## 2019-02-24 MED ORDER — METHIMAZOLE 10 MG PO TABS: 5.0000 mg | ORAL_TABLET | Freq: Every day | ORAL | 0 refills | Status: DC

## 2019-02-24 NOTE — Telephone Encounter (Signed)
Can you please let her know that her thyroid is normal, and her potassium and calcium are normal .   No endocrine reasons for spasms.    New Recommendations   Decrease Methimazole to half a tablet daily ( so she will be taking 5 mg daily )    Thank you    Abby Nena Jordan, MD  [facility] Endocrinology  [facility] [address] [place], [place] [address] Phone: [phone] FAX: [phone]

## 2019-02-24 NOTE — Progress Notes (Signed)
Name: Tammy [name]  MRN/ DOB: [id number], Nov 04, 1955    Age/ Sex: 63 y.o., female     PCP: Sander Radon, NP   Reason for Endocrinology Evaluation: Hyperthyroidism     Initial Endocrinology Clinic Visit: 05/18/18    PATIENT IDENTIFIER: Tammy [name] is a 63 y.o., female with a past medical history of Myasthenia Gravis  . She has followed with [facility] [facility] since 05/18/18 for consultative assistance with management of her low TSH .   HISTORICAL SUMMARY:  Pt was found to have hyperthyroidism on routine labs in 03/2018, with a TSH of 0.008 uIU/mL and elevated T4 of 2.45 ng/dL.    In November, 2019 she was diagnosed with Myasthenia Gravis, she was initially on on Cellcept, Mestinon and prednisone. Prednisone was tapered off fol lowed by mestinon and currently  On cellcept.  By the time of her presentation to our clinic, her FT4 have normalized and TSH was improving so we opted not to start any thionamide therapy at the time .  Methimazole was started in 07/2018 with a TSH of < 0.006 uIU/mL and elevated FT4 at 2.99  SUBJECTIVE:   During last visit (11/30/18): We continued methimazole 15 mg daily   Today (02/24/2019):  Tammy [name] is here for a follow up on her graves's disease.   She has been on methimazole since 07/2018. Currently at 10 mg daily   Her weight has been stable.  She continues to have double vision of the right eye. Saw a thyroid eye specialist at [facility] and was told its not related to thyroid . She is scheduled to see neuro-ophthalmologist physician.  She continues with photosensitivity, burning and irritation of the eyes.   Denies palpitations or sob Denies anxiety or jittery sensation.  Denies local neck enlargement.   Denies Gi symptoms.    ROS:  As per HPI.   HISTORY:  Past Medical History:  Past Medical History:  Diagnosis Date  . Environmental allergies   . Headache(784.0)    last one 1 year ago  . Migraines   . PONV  (postoperative nausea and vomiting)    Past Surgical History:  Past Surgical History:  Procedure Laterality Date  . ABDOMINAL HYSTERECTOMY  1980's   total  . ANTERIOR CERVICAL DECOMP/DISCECTOMY FUSION  07/29/2011   Procedure: ANTERIOR CERVICAL DECOMPRESSION/DISCECTOMY FUSION 3 LEVEL/HARDWARE REMOVAL;  Surgeon: Ophelia Charter, MD;  Location: [place] [facility];  Service: Neurosurgery;  Laterality: Bilateral;  Cervical four-five, five-six, six-seven anterior cervical decompresion with fusion, interbody prothesis, plating and bonegraft  . HIP SURGERY  2006   repair post injury  . [facility] SINUS  2005    Social History:  reports that she has quit smoking. Her smoking use included cigarettes. She has a 10.00 pack-year smoking history. She has never used smokeless tobacco. She reports current alcohol use. She reports that she does not use drugs.  Family History: family history includes COPD in her mother.       OBJECTIVE:   PHYSICAL EXAM: VS: There were no vitals taken for this visit.   EXAM: General: Pt appears well and is in NAD  Eyes: External eye exam normal without stare, lid lag or exophthalmos.  EOM intact.    Neck: General: Supple without adenopathy. Thyroid: Thyroid size normal.  No goiter or nodules appreciated. No thyroid bruit.  Lungs: Clear with good BS bilat with no rales, rhonchi, or wheezes  Heart: Auscultation: RRR.  Extremities:  BL LE: No pretibial edema  normal ROM and strength.  Skin: Hair: Texture and amount normal with gender appropriate distribution Skin Inspection: No rashes. Skin Palpation: Skin temperature, texture, and thickness normal to palpation  Mental Status: Judgment, insight: Intact Orientation: Oriented to time, place, and person Mood and affect: No depression, anxiety, or agitation     DATA REVIEWED: Results for Tammy, [name] (MRN [id number]) as of 12/02/2018 10:38  Ref. Range 08/23/2018 14:42 11/30/2018 11:45 11/30/2018 11:45  TSH Latest Ref Range:  0.35 - 4.50 uIU/mL <0.006 (L) 0.70 0.69  T4,Free(Direct) Latest Ref Range: 0.60 - 1.60 ng/dL 2.99 (H) 0.61        Results for Tammy, [name] (MRN [id number]) as of 06/24/2018 07:55  Ref. Range 05/18/2018 15:26  Thyrotropin Receptor Ab Latest Ref Range: <=16.0 % 23.9 (H)    ASSESSMENT / PLAN / RECOMMENDATIONS:   1. Hyperthyroidism Secondary to Graves' Disease:    [name]   Denies any local neck symptoms.   Repeat TFT's today show normal TFT's but since the FT4 is on the lower end of normal, will reduce methimazole dose as below  Medications   Methimazole 15 mg daily     2. Graves' Disease :  - Pt continues to have double vision, photosensitivity, and burning of the eyes.     F/u in 3 months  Labs in 6 weeks    Discussed lab results with pt and recommendations on 12/02/2018 at 10:30 am   Signed electronically by: Mack Guise, MD  [facility] Endocrinology  [facility] [address] [place], [place] [address] Phone: [phone] FAX: [phone]      CC: Sander Radon, NP [place] [address] Phone: [phone]  Fax: [phone]   Return to Endocrinology clinic as below: Future Appointments  Date Time Provider [place]  02/24/2019  9:30 AM , Melanie Crazier, MD [facility] None  03/02/2019  8:10 AM , Melanie Crazier, MD [facility] None

## 2019-02-24 NOTE — Progress Notes (Signed)
Name: Tammy [name]  MRN/ DOB: [id number], 1956-02-13    Age/ Sex: 63 y.o., female     PCP: Sander Radon, NP   Reason for Endocrinology Evaluation: Hyperthyroidism     Initial Endocrinology Clinic Visit: 05/18/18    PATIENT IDENTIFIER: Tammy [name] is a 63 y.o., female with a past medical history of Myasthenia Gravis  . She has followed with [facility] [facility] since 05/18/18 for consultative assistance with management of her low TSH .   HISTORICAL SUMMARY:  Pt was found to have hyperthyroidism on routine labs in 03/2018, with a TSH of 0.008 uIU/mL and elevated T4 of 2.45 ng/dL.    In November, 2019 she was diagnosed with Myasthenia Gravis, she was initially on on Cellcept, Mestinon and prednisone. Prednisone was tapered off fol lowed by mestinon and currently  On cellcept.  By the time of her presentation to our clinic, her FT4 have normalized and TSH was improving so we opted not to start any thionamide therapy at the time .  Methimazole was started in 07/2018 with a TSH of < 0.006 uIU/mL and elevated FT4 at 2.99  SUBJECTIVE:   During last visit (11/30/18): We continued methimazole 15 mg daily   Today (02/24/2019):  Tammy [name] is here for a follow up on her graves's disease. Since her last visit we have cut down on her methimazole to 10 mg daily   She has been on methimazole since 07/2018.   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 She started [place] , has gotten 2 doses so far which has helped a lot of her eye symptoms. First dose was in July, 2020.  Today her main concern is muscle spasms in her extremities and at times around the neck area that started 2 weeks ago, she was also noted to have a heart rate in the 40's. While attempting to have her 2nd infusion of tepezza . After reading online, she is under the impression that her symptoms have to do with her thyroid.    Denies tingling and numbness   Denies anxiety or jittery sensation.   Denies local neck enlargement.  Denies GI  symptoms.    ROS:  As per HPI.   HISTORY:  Past Medical History:  Past Medical History:  Diagnosis Date  . Environmental allergies   . Headache(784.0)    last one 1 year ago  . Migraines   . PONV (postoperative nausea and vomiting)    Past Surgical History:  Past Surgical History:  Procedure Laterality Date  . ABDOMINAL HYSTERECTOMY  1980's   total  . ANTERIOR CERVICAL DECOMP/DISCECTOMY FUSION  07/29/2011   Procedure: ANTERIOR CERVICAL DECOMPRESSION/DISCECTOMY FUSION 3 LEVEL/HARDWARE REMOVAL;  Surgeon: Ophelia Charter, MD;  Location: [place] NEUR[facility] ORS;  Service: Neurosurgery;  Laterality: Bilateral;  Cervical four-five, five-six, six-seven anterior cervical decompresion with fusion, interbody prothesis, plating and bonegraft  . HIP SURGERY  2006   repair post injury  . [facility] SINUS  2005    Social History:  reports that she has quit smoking. Her smoking use included cigarettes. She has a 10.00 pack-year smoking history. She has never used smokeless tobacco. She reports current alcohol use. She reports that she does not use drugs.  Family History: family history includes COPD in her mother.       OBJECTIVE:   PHYSICAL EXAM: VS: BP 104/68 (BP Location: Left Arm, Patient Position: Sitting, Cuff Size: Normal)   Pulse 61   Temp 98.1 F (  36.7 C)   Ht 4\' 11"  (1.499 m)   Wt 116 lb 12.8 oz (53 kg)   SpO2 98%   BMI 23.59 kg/m    EXAM: General: Pt appears well and is in NAD  Neck: General: Supple without adenopathy. Thyroid: Thyroid size normal.  No goiter or nodules appreciated. No thyroid bruit.  Lungs: Clear with good BS bilat with no rales, rhonchi, or wheezes  Heart: Auscultation: RRR.  Extremities:  BL LE: No pretibial edema normal ROM and strength.  Skin: Hair: Texture and amount normal with gender appropriate distribution Skin Inspection: No rashes. Skin Palpation: Skin temperature, texture, and thickness normal to  palpation  Mental Status: Judgment, insight: Intact Orientation: Oriented to time, place, and person Mood and affect: No depression, anxiety, or agitation     DATA REVIEWED:  Results for Tammy [name], Tammy [name] (MRN [id number]) as of 02/24/2019 16:49  Ref. Range 02/24/2019 14:20  Sodium Latest Ref Range: 135 - 145 mEq/L 138  Potassium Latest Ref Range: 3.5 - 5.1 mEq/L 4.0  Chloride Latest Ref Range: 96 - 112 mEq/L 102  CO2 Latest Ref Range: 19 - 32 mEq/L 29  Glucose Latest Ref Range: 70 - 99 mg/dL 86  BUN Latest Ref Range: 6 - 23 mg/dL 17  Creatinine Latest Ref Range: 0.40 - 1.20 mg/dL 0.78  Calcium Latest Ref Range: 8.4 - 10.5 mg/dL 8.9  GFR Latest Ref Range: >60.00 mL/min 75.11  TSH Latest Ref Range: 0.35 - 4.50 uIU/mL 4.06  T4,Free(Direct) Latest Ref Range: 0.60 - 1.60 ng/dL 0.76    Results for Tammy [name], Tammy [name] (MRN [id number]) as of 06/24/2018 07:55  Ref. Range 05/18/2018 15:26  Thyrotropin Receptor Ab Latest Ref Range: <=16.0 % 23.9 (H)    ASSESSMENT / PLAN / RECOMMENDATIONS:   1. Hyperthyroidism Secondary to Graves' Disease:    Pt with multiple non-specific symptoms that are not related to her thyroid   Denies any local neck symptoms.   Repeat TFT's today are normal, but will reduce methimazole dose as below   Medications   Decrease Methimazole 10 mg to half a tablet  daily    2. Graves' Disease :  - Pt with graves' orbitopathy, she is on tepezza   3. Muscle Spasms/carmps :   - Her symptoms initially sounded like she has hypocalcemia, but CMP today ruled out hypocalcemia and hypokalemia.  - I have advised her to stop using the new exercise machine - I have advised her to stay hydrated and to call 911 if she has throat cramps      F/u in 3 months  Labs in 6 weeks    Discussed lab results with pt and recommendations on 12/02/2018 at 10:30 am   Signed electronically by: Mack Guise, MD  [facility] Endocrinology  [facility] [address], [place] [address] Phone: [phone] FAX: [phone]      CC: Sander Radon, NP [place] [address] Phone: [phone]  Fax: [phone]   Return to Endocrinology clinic as below: Future Appointments  Date Time Provider [place]  04/03/2019 10:30 AM [facility] None

## 2019-02-24 NOTE — Telephone Encounter (Signed)
Attempted to call again 02/24/19 at 1700

## 2019-02-24 NOTE — Patient Instructions (Signed)
-   Stop by the lab today

## 2019-02-24 NOTE — Telephone Encounter (Signed)
Attempted to call pt lft vm to return call to discuss labs

## 2019-02-27 NOTE — Telephone Encounter (Signed)
Patient advised and verbalized understanding 

## 2019-03-02 ENCOUNTER — Ambulatory Visit: Payer: BC Managed Care – PPO | Admitting: Internal Medicine

## 2019-03-09 DIAGNOSIS — Z13220 Encounter for screening for lipoid disorders: Secondary | ICD-10-CM | POA: Diagnosis not present

## 2019-03-09 DIAGNOSIS — Z131 Encounter for screening for diabetes mellitus: Secondary | ICD-10-CM | POA: Diagnosis not present

## 2019-03-09 DIAGNOSIS — E559 Vitamin D deficiency, unspecified: Secondary | ICD-10-CM | POA: Diagnosis not present

## 2019-03-09 DIAGNOSIS — Z0000 Encounter for general adult medical examination without abnormal findings: Secondary | ICD-10-CM | POA: Diagnosis not present

## 2019-03-14 ENCOUNTER — Other Ambulatory Visit: Payer: Self-pay | Admitting: *Deleted

## 2019-03-14 MED ORDER — MYCOPHENOLATE MOFETIL 500 MG PO TABS: 1000.0000 mg | ORAL_TABLET | Freq: Two times a day (BID) | ORAL | 0 refills | Status: DC

## 2019-03-15 DIAGNOSIS — G7000 Myasthenia gravis without (acute) exacerbation: Secondary | ICD-10-CM | POA: Diagnosis not present

## 2019-03-15 DIAGNOSIS — H5021 Vertical strabismus, right eye: Secondary | ICD-10-CM | POA: Diagnosis not present

## 2019-03-15 DIAGNOSIS — H532 Diplopia: Secondary | ICD-10-CM | POA: Diagnosis not present

## 2019-03-15 DIAGNOSIS — E0500 Thyrotoxicosis with diffuse goiter without thyrotoxic crisis or storm: Secondary | ICD-10-CM | POA: Diagnosis not present

## 2019-03-16 DIAGNOSIS — H5021 Vertical strabismus, right eye: Secondary | ICD-10-CM | POA: Diagnosis not present

## 2019-03-16 DIAGNOSIS — H532 Diplopia: Secondary | ICD-10-CM | POA: Diagnosis not present

## 2019-03-16 DIAGNOSIS — G7000 Myasthenia gravis without (acute) exacerbation: Secondary | ICD-10-CM | POA: Diagnosis not present

## 2019-03-16 DIAGNOSIS — E0500 Thyrotoxicosis with diffuse goiter without thyrotoxic crisis or storm: Secondary | ICD-10-CM | POA: Diagnosis not present

## 2019-03-28 ENCOUNTER — Other Ambulatory Visit: Payer: Self-pay | Admitting: Neurology

## 2019-04-03 ENCOUNTER — Other Ambulatory Visit: Payer: Self-pay

## 2019-04-03 ENCOUNTER — Other Ambulatory Visit (INDEPENDENT_AMBULATORY_CARE_PROVIDER_SITE_OTHER): Payer: BC Managed Care – PPO

## 2019-04-03 DIAGNOSIS — E0500 Thyrotoxicosis with diffuse goiter without thyrotoxic crisis or storm: Secondary | ICD-10-CM

## 2019-04-03 LAB — T4, FREE: Free T4: 0.84 ng/dL (ref 0.60–1.60)

## 2019-04-03 LAB — TSH: TSH: 3.64 u[IU]/mL (ref 0.35–4.50)

## 2019-04-04 ENCOUNTER — Telehealth: Payer: Self-pay | Admitting: Internal Medicine

## 2019-04-04 MED ORDER — METHIMAZOLE 5 MG PO TABS: 2.5000 mg | ORAL_TABLET | Freq: Every day | ORAL | 3 refills | Status: DC

## 2019-04-04 NOTE — Telephone Encounter (Signed)
  Discussed lab results with the pt as below     Results for Tammy [name], Tammy [name] (MRN [id number]) as of 04/04/2019 12:16  Ref. Range 04/03/2019 11:00  TSH Latest Ref Range: 0.35 - 4.50 uIU/mL 3.64  T4,Free(Direct) Latest Ref Range: 0.60 - 1.60 ng/dL 0.84    She is currently on 5 mg of methimazole    She was advised to reduce the dose to 2.5 mg daily. She currently has the 10 mg daily , I offered to send the 5 mg tabs but she just picked up a new prescription of the 10 mg and believes she can cut it in to quarters ?      Abby Nena Jordan, MD  [facility]  [facility] [address] [place], [place] [address] Phone: [phone] FAX: [phone]

## 2019-04-06 DIAGNOSIS — H532 Diplopia: Secondary | ICD-10-CM | POA: Diagnosis not present

## 2019-04-06 DIAGNOSIS — E0500 Thyrotoxicosis with diffuse goiter without thyrotoxic crisis or storm: Secondary | ICD-10-CM | POA: Diagnosis not present

## 2019-04-06 DIAGNOSIS — H5021 Vertical strabismus, right eye: Secondary | ICD-10-CM | POA: Diagnosis not present

## 2019-04-06 DIAGNOSIS — G7000 Myasthenia gravis without (acute) exacerbation: Secondary | ICD-10-CM | POA: Diagnosis not present

## 2019-04-27 DIAGNOSIS — H5021 Vertical strabismus, right eye: Secondary | ICD-10-CM | POA: Diagnosis not present

## 2019-04-27 DIAGNOSIS — G7000 Myasthenia gravis without (acute) exacerbation: Secondary | ICD-10-CM | POA: Diagnosis not present

## 2019-04-27 DIAGNOSIS — E0500 Thyrotoxicosis with diffuse goiter without thyrotoxic crisis or storm: Secondary | ICD-10-CM | POA: Diagnosis not present

## 2019-04-27 DIAGNOSIS — H532 Diplopia: Secondary | ICD-10-CM | POA: Diagnosis not present

## 2019-04-28 DIAGNOSIS — H532 Diplopia: Secondary | ICD-10-CM | POA: Diagnosis not present

## 2019-04-28 DIAGNOSIS — H5021 Vertical strabismus, right eye: Secondary | ICD-10-CM | POA: Diagnosis not present

## 2019-04-28 DIAGNOSIS — G7000 Myasthenia gravis without (acute) exacerbation: Secondary | ICD-10-CM | POA: Diagnosis not present

## 2019-04-28 DIAGNOSIS — E0500 Thyrotoxicosis with diffuse goiter without thyrotoxic crisis or storm: Secondary | ICD-10-CM | POA: Diagnosis not present

## 2019-05-02 ENCOUNTER — Other Ambulatory Visit: Payer: Self-pay | Admitting: Internal Medicine

## 2019-05-02 DIAGNOSIS — R197 Diarrhea, unspecified: Secondary | ICD-10-CM | POA: Diagnosis not present

## 2019-05-05 ENCOUNTER — Other Ambulatory Visit: Payer: Self-pay | Admitting: Neurology

## 2019-05-08 DIAGNOSIS — E0500 Thyrotoxicosis with diffuse goiter without thyrotoxic crisis or storm: Secondary | ICD-10-CM | POA: Diagnosis not present

## 2019-05-08 DIAGNOSIS — G7000 Myasthenia gravis without (acute) exacerbation: Secondary | ICD-10-CM | POA: Diagnosis not present

## 2019-05-10 ENCOUNTER — Ambulatory Visit
Admission: RE | Admit: 2019-05-10 | Discharge: 2019-05-10 | Disposition: A | Discharge status: Home / Self Care | Payer: BC Managed Care – PPO | Source: Ambulatory Visit | Attending: Internal Medicine | Admitting: Internal Medicine

## 2019-05-10 ENCOUNTER — Other Ambulatory Visit: Payer: Self-pay | Admitting: Internal Medicine

## 2019-05-10 ENCOUNTER — Other Ambulatory Visit: Payer: Self-pay

## 2019-05-10 DIAGNOSIS — R197 Diarrhea, unspecified: Secondary | ICD-10-CM | POA: Diagnosis not present

## 2019-05-10 DIAGNOSIS — R1031 Right lower quadrant pain: Secondary | ICD-10-CM | POA: Insufficient documentation

## 2019-05-10 DIAGNOSIS — K2990 Gastroduodenitis, unspecified, without bleeding: Secondary | ICD-10-CM | POA: Diagnosis not present

## 2019-05-10 MED ORDER — IOHEXOL 300 MG/ML  SOLN
100.0000 mL | Freq: Once | INTRAMUSCULAR | Status: AC | PRN
  Administered 2019-05-10: 100 mL via INTRAVENOUS

## 2019-05-11 ENCOUNTER — Other Ambulatory Visit: Payer: Self-pay | Admitting: Internal Medicine

## 2019-05-11 DIAGNOSIS — R1031 Right lower quadrant pain: Secondary | ICD-10-CM

## 2019-05-18 DIAGNOSIS — E0500 Thyrotoxicosis with diffuse goiter without thyrotoxic crisis or storm: Secondary | ICD-10-CM | POA: Diagnosis not present

## 2019-05-18 DIAGNOSIS — H5021 Vertical strabismus, right eye: Secondary | ICD-10-CM | POA: Diagnosis not present

## 2019-05-18 DIAGNOSIS — G7000 Myasthenia gravis without (acute) exacerbation: Secondary | ICD-10-CM | POA: Diagnosis not present

## 2019-05-18 DIAGNOSIS — H532 Diplopia: Secondary | ICD-10-CM | POA: Diagnosis not present

## 2019-05-19 DIAGNOSIS — R197 Diarrhea, unspecified: Secondary | ICD-10-CM | POA: Diagnosis not present

## 2019-05-19 DIAGNOSIS — K2990 Gastroduodenitis, unspecified, without bleeding: Secondary | ICD-10-CM | POA: Diagnosis not present

## 2019-05-30 DIAGNOSIS — K580 Irritable bowel syndrome with diarrhea: Secondary | ICD-10-CM | POA: Diagnosis not present

## 2019-05-30 DIAGNOSIS — K219 Gastro-esophageal reflux disease without esophagitis: Secondary | ICD-10-CM | POA: Diagnosis not present

## 2019-05-31 ENCOUNTER — Other Ambulatory Visit: Payer: Self-pay | Admitting: Neurology

## 2019-06-08 DIAGNOSIS — E0500 Thyrotoxicosis with diffuse goiter without thyrotoxic crisis or storm: Secondary | ICD-10-CM | POA: Diagnosis not present

## 2019-06-08 DIAGNOSIS — H532 Diplopia: Secondary | ICD-10-CM | POA: Diagnosis not present

## 2019-06-08 DIAGNOSIS — H5021 Vertical strabismus, right eye: Secondary | ICD-10-CM | POA: Diagnosis not present

## 2019-06-08 DIAGNOSIS — G7000 Myasthenia gravis without (acute) exacerbation: Secondary | ICD-10-CM | POA: Diagnosis not present

## 2019-06-09 DIAGNOSIS — H532 Diplopia: Secondary | ICD-10-CM | POA: Diagnosis not present

## 2019-06-09 DIAGNOSIS — E0500 Thyrotoxicosis with diffuse goiter without thyrotoxic crisis or storm: Secondary | ICD-10-CM | POA: Diagnosis not present

## 2019-06-09 DIAGNOSIS — G7000 Myasthenia gravis without (acute) exacerbation: Secondary | ICD-10-CM | POA: Diagnosis not present

## 2019-06-09 DIAGNOSIS — H5021 Vertical strabismus, right eye: Secondary | ICD-10-CM | POA: Diagnosis not present

## 2019-06-15 IMAGING — CT CT CHEST W/O CM
1 series · 14 of 34 positions shown, 18 images · non-contrast
Comparison: No priors.

CLINICAL DATA: 62-year-old female with history of myasthenia
gravis. Double vision. Difficulty swelling. Chest tightness for 1
year.

EXAM:
CT CHEST WITHOUT CONTRAST
TECHNIQUE: Multidetector CT imaging of the chest was performed following the
standard protocol without IV contrast.

[Series 2: chest w/(date) · axial · 0.70mm/px · z∈[-283,-9]mm · 14 of 161 slices shown, 18 images]
[im 12/161  mediastinal]
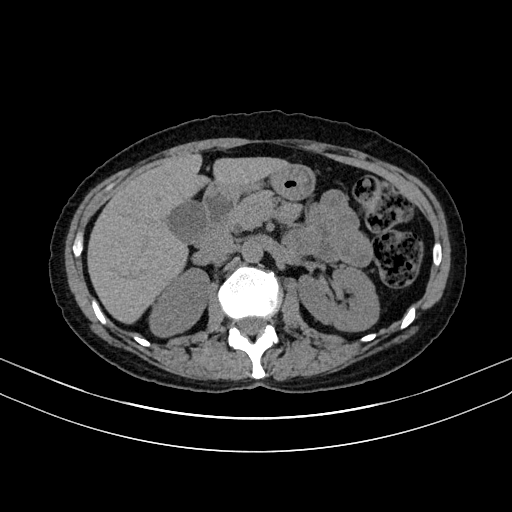
[im 12/161  lung]
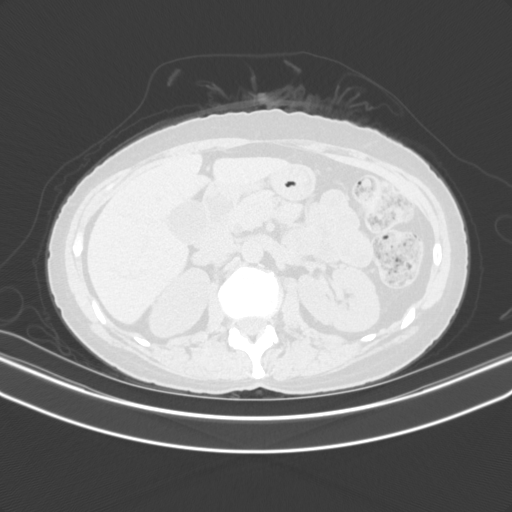
[im 24/161  lung]
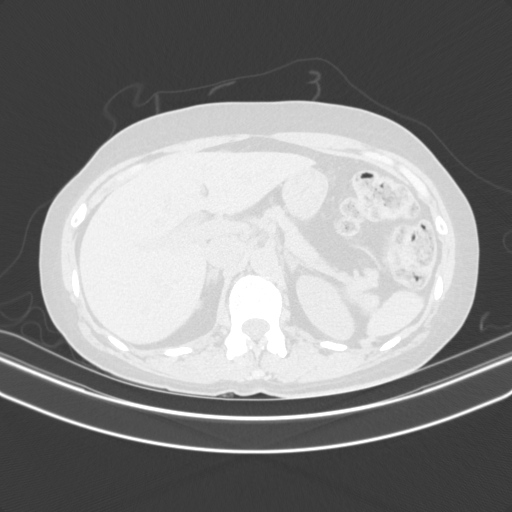
[im 33/161  lung]
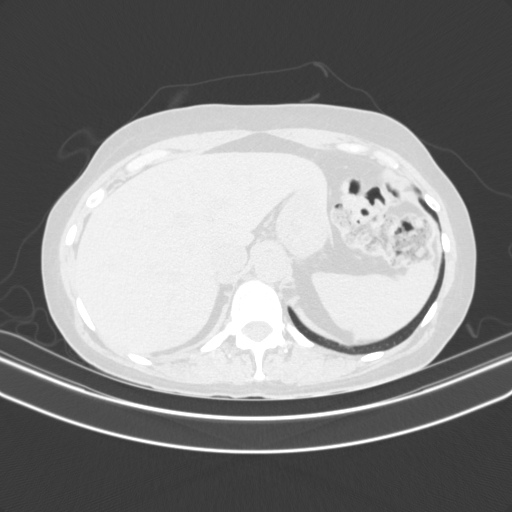
[im 48/161  lung]
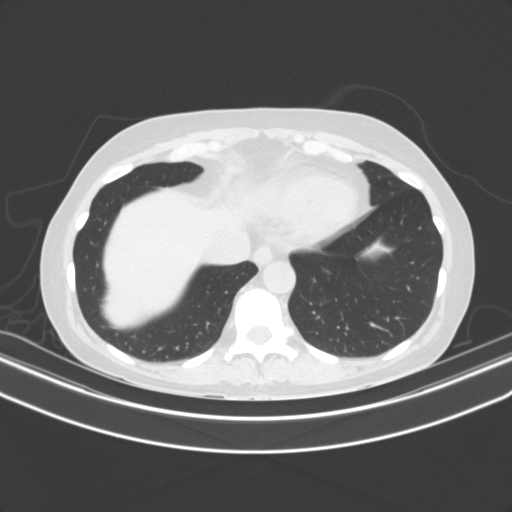
[im 60/161  mediastinal]
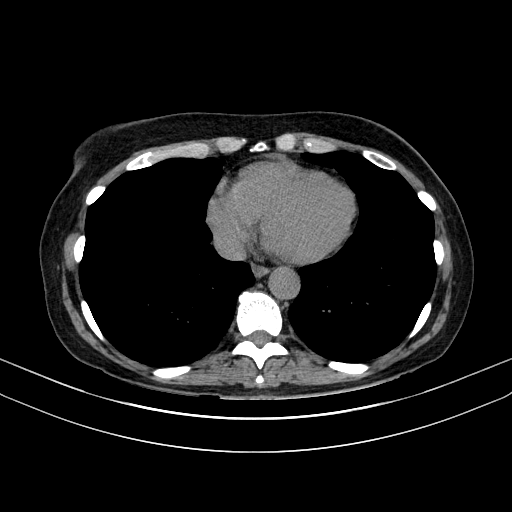
[im 60/161  lung]
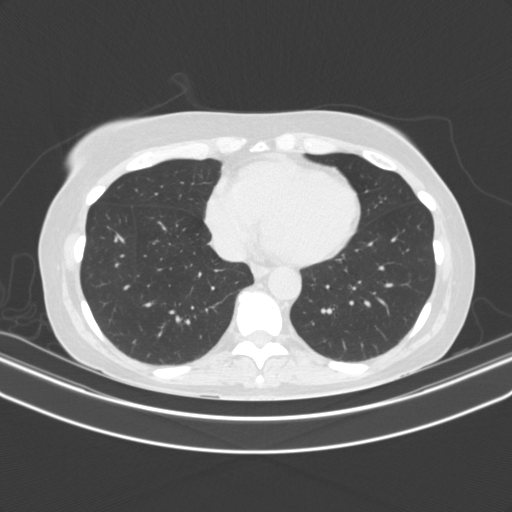
[im 66/161  lung]
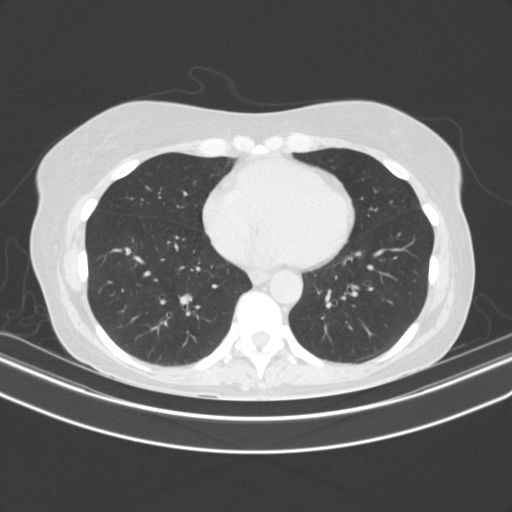
[im 76/161  lung]
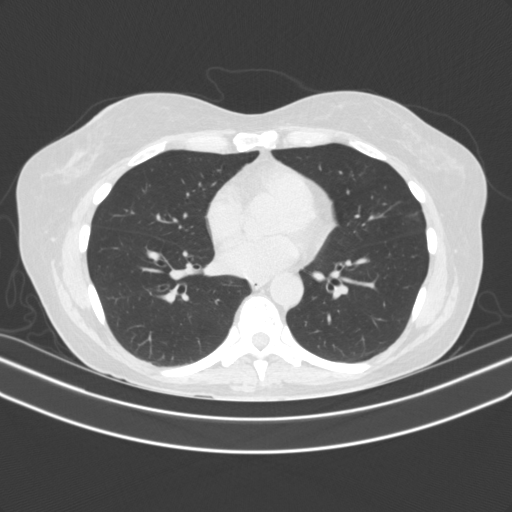
[im 86/161  lung]
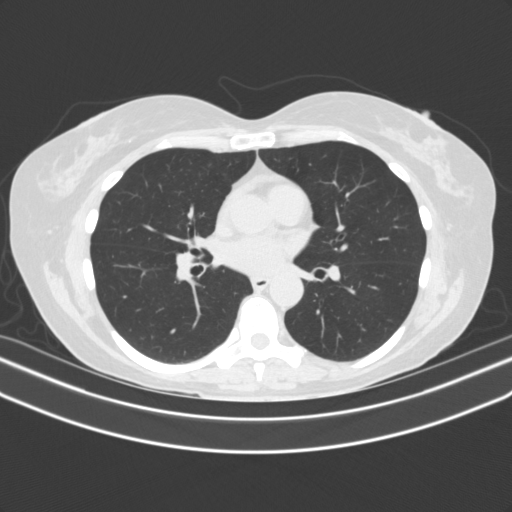
[im 95/161  mediastinal]
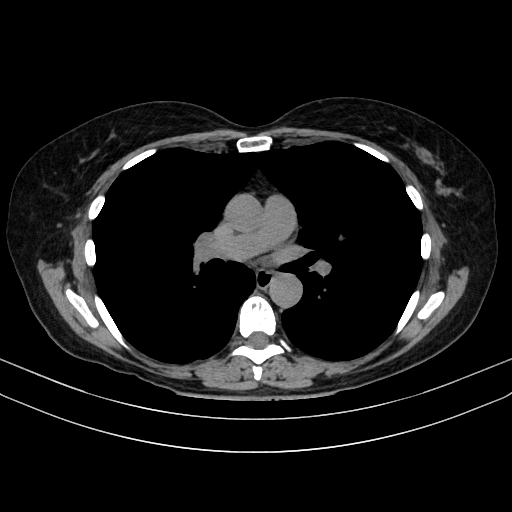
[im 95/161  lung]
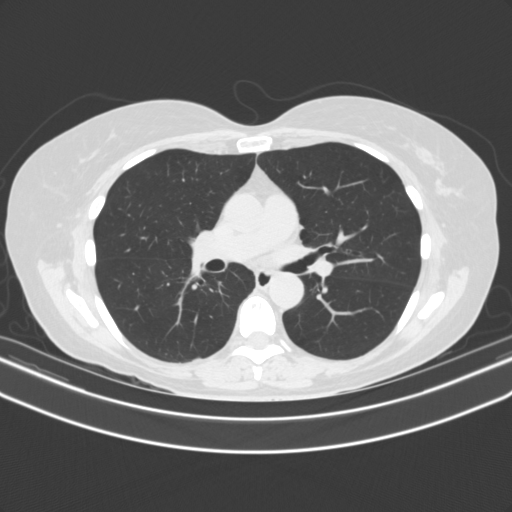
[im 101/161  lung]
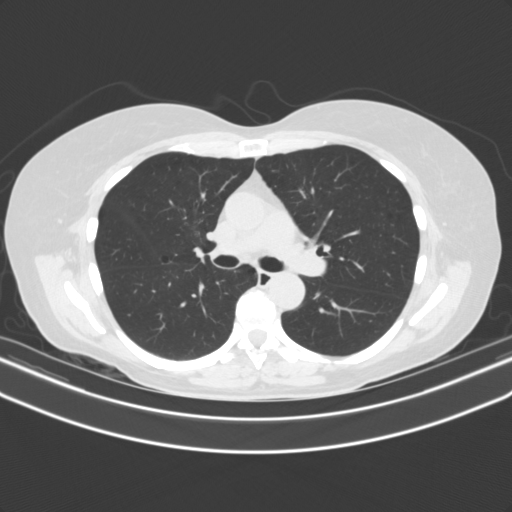
[im 119/161  lung]
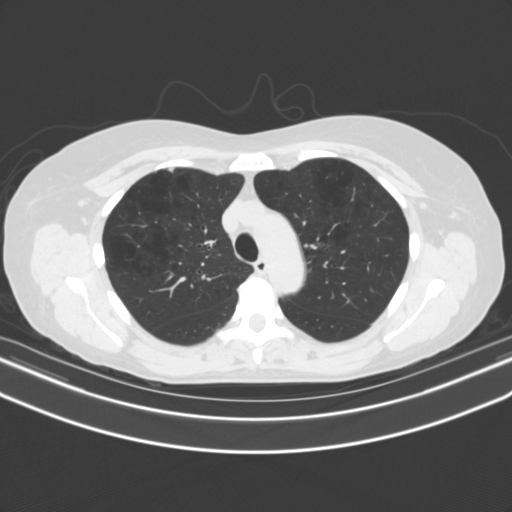
[im 129/161  lung]
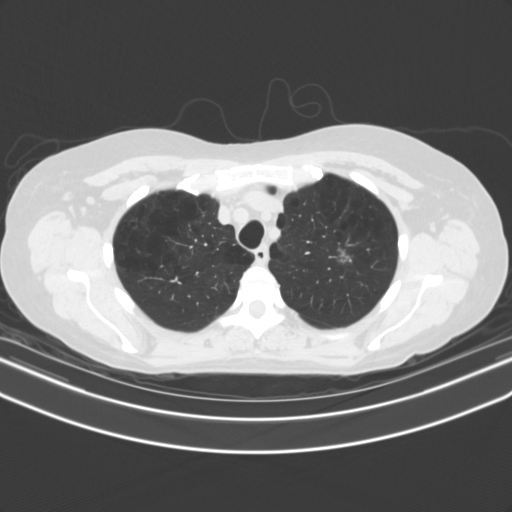
[im 137/161  mediastinal]
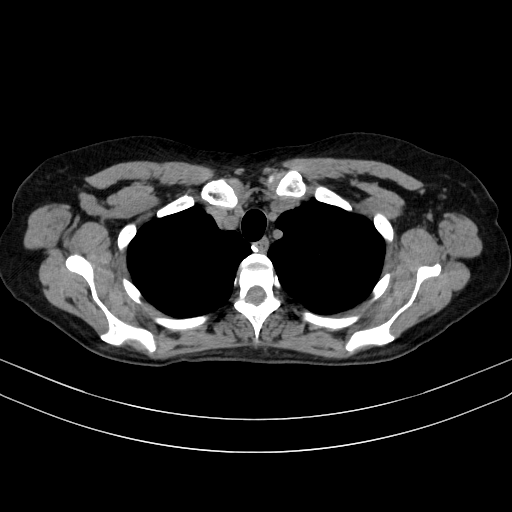
[im 137/161  lung]
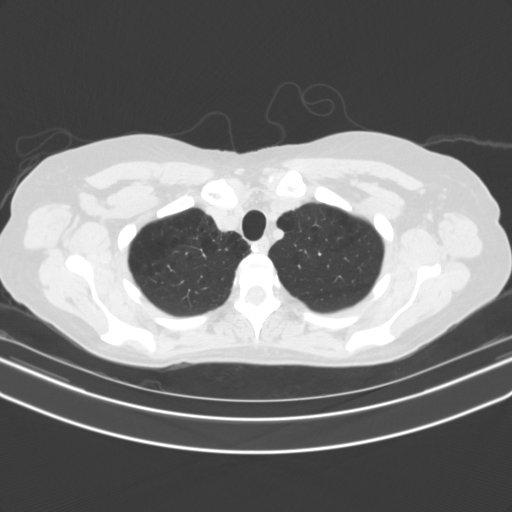
[im 149/161  lung]
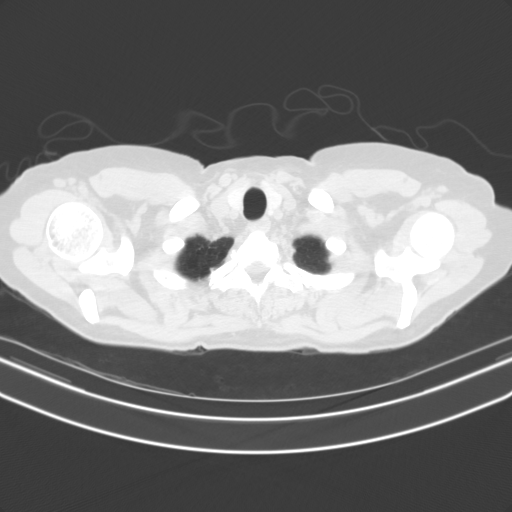

[14 of 34 positions shown; findings below may reference images not displayed]

FINDINGS: Cardiovascular: Heart size is normal. There is no significant
pericardial fluid, thickening or pericardial calcification. There is
aortic atherosclerosis, as well as atherosclerosis of the great
vessels of the mediastinum and the coronary arteries, including
calcified atherosclerotic plaque in the left anterior descending
coronary artery.

Mediastinum/Nodes: No unexpected soft tissue mass in the mediastinum
to suggest the presence of a thymoma. No pathologically enlarged
mediastinal or hilar lymph nodes. Please note that accurate
exclusion of hilar adenopathy is limited on noncontrast CT scans.
Esophagus is unremarkable in appearance. No axillary
lymphadenopathy.

Lungs/Pleura: Diffuse bronchial wall thickening with mild to
moderate centrilobular and paraseptal emphysema. Two ground-glass
attenuation nodules are noted. The largest of these measures 9 x 12
mm in the left upper lobe (axial image 33 of series 5). The other
lesion is in the inferior aspect of the right upper lobe abutting
the minor fissure (axial image 54 of series 5) measuring 7 x 5 mm.
No acute consolidative airspace disease. No pleural effusions.

Upper Abdomen: Small calcified gallstone lying dependently in the
gallbladder.

Musculoskeletal: Orthopedic fixation hardware in the lower cervical
spine. There are no aggressive appearing lytic or blastic lesions
noted in the visualized portions of the skeleton.
IMPRESSION: 1. No evidence of thymoma.
2. Diffuse bronchial wall thickening with mild to moderate
centrilobular and paraseptal emphysema; imaging findings suggestive
of underlying COPD.
3. Two ground-glass attenuation nodules, largest of which is in the
left upper lobe measuring 9 x 12 mm. Non-contrast chest CT at 3-6
months is recommended. If nodules persist, subsequent management
will be based upon the most suspicious nodule(s). This
recommendation follows the consensus statement: Guidelines for
Management of Incidental Pulmonary Nodules Detected on CT Images:
4. Aortic atherosclerosis, in addition to left anterior descending
coronary artery disease. Please note that although the presence of
coronary artery calcium documents the presence of coronary artery
disease, the severity of this disease and any potential stenosis
cannot be assessed on this non-gated CT examination. Assessment for
potential risk factor modification, dietary therapy or pharmacologic
therapy may be warranted, if clinically indicated.
5. Cholelithiasis.

Aortic Atherosclerosis (3L1TN-00D.D) and Emphysema (3L1TN-VR7.J).

## 2019-06-20 ENCOUNTER — Other Ambulatory Visit: Payer: Self-pay | Admitting: Neurology

## 2019-06-28 ENCOUNTER — Other Ambulatory Visit: Payer: Self-pay | Admitting: Neurology

## 2019-06-28 MED ORDER — GABAPENTIN 300 MG PO CAPS: 300.0000 mg | ORAL_CAPSULE | Freq: Three times a day (TID) | ORAL | 2 refills | Status: DC

## 2019-06-28 NOTE — Telephone Encounter (Signed)
Patient has FU schedule for March, will refill gabapentin until he comes for FU.  Note to pharmacy on refill: patient must come for FU in MArch 2021.

## 2019-06-28 NOTE — Telephone Encounter (Signed)
Pt is requesting a refill of  gabapentin (NEURONTIN) 300 MG capsule, to be sent to [place], [place]

## 2019-06-29 DIAGNOSIS — R05 Cough: Secondary | ICD-10-CM | POA: Diagnosis not present

## 2019-06-29 DIAGNOSIS — J3089 Other allergic rhinitis: Secondary | ICD-10-CM | POA: Diagnosis not present

## 2019-06-29 DIAGNOSIS — R21 Rash and other nonspecific skin eruption: Secondary | ICD-10-CM | POA: Diagnosis not present

## 2019-06-29 DIAGNOSIS — H1045 Other chronic allergic conjunctivitis: Secondary | ICD-10-CM | POA: Diagnosis not present

## 2019-07-05 DIAGNOSIS — G7000 Myasthenia gravis without (acute) exacerbation: Secondary | ICD-10-CM | POA: Diagnosis not present

## 2019-07-05 DIAGNOSIS — H5021 Vertical strabismus, right eye: Secondary | ICD-10-CM | POA: Diagnosis not present

## 2019-07-05 DIAGNOSIS — E0500 Thyrotoxicosis with diffuse goiter without thyrotoxic crisis or storm: Secondary | ICD-10-CM | POA: Diagnosis not present

## 2019-07-05 DIAGNOSIS — H532 Diplopia: Secondary | ICD-10-CM | POA: Diagnosis not present

## 2019-07-06 DIAGNOSIS — G7000 Myasthenia gravis without (acute) exacerbation: Secondary | ICD-10-CM | POA: Diagnosis not present

## 2019-07-06 DIAGNOSIS — H5021 Vertical strabismus, right eye: Secondary | ICD-10-CM | POA: Diagnosis not present

## 2019-07-06 DIAGNOSIS — E0500 Thyrotoxicosis with diffuse goiter without thyrotoxic crisis or storm: Secondary | ICD-10-CM | POA: Diagnosis not present

## 2019-07-06 DIAGNOSIS — H532 Diplopia: Secondary | ICD-10-CM | POA: Diagnosis not present

## 2019-07-28 ENCOUNTER — Other Ambulatory Visit: Payer: Self-pay | Admitting: Neurology

## 2019-08-14 DIAGNOSIS — G7000 Myasthenia gravis without (acute) exacerbation: Secondary | ICD-10-CM | POA: Diagnosis not present

## 2019-08-14 DIAGNOSIS — H5021 Vertical strabismus, right eye: Secondary | ICD-10-CM | POA: Diagnosis not present

## 2019-08-14 DIAGNOSIS — E0500 Thyrotoxicosis with diffuse goiter without thyrotoxic crisis or storm: Secondary | ICD-10-CM | POA: Diagnosis not present

## 2019-08-14 DIAGNOSIS — H532 Diplopia: Secondary | ICD-10-CM | POA: Diagnosis not present

## 2019-08-21 ENCOUNTER — Telehealth: Payer: Self-pay

## 2019-08-21 NOTE — Telephone Encounter (Signed)
Patient states that she thinks she is having thyroid issues.  Hair loss, nail pealing and skin coming off the end of her fingers.   Please call back to advise

## 2019-08-21 NOTE — Telephone Encounter (Signed)
Please schedule appointment.

## 2019-08-22 ENCOUNTER — Other Ambulatory Visit: Payer: Self-pay

## 2019-08-24 ENCOUNTER — Encounter: Payer: Self-pay | Admitting: Internal Medicine

## 2019-08-24 ENCOUNTER — Ambulatory Visit (INDEPENDENT_AMBULATORY_CARE_PROVIDER_SITE_OTHER): Payer: BC Managed Care – PPO | Admitting: Internal Medicine

## 2019-08-24 ENCOUNTER — Other Ambulatory Visit: Payer: Self-pay

## 2019-08-24 ENCOUNTER — Ambulatory Visit: Payer: BC Managed Care – PPO | Admitting: Internal Medicine

## 2019-08-24 VITALS — BP 104/60 | HR 59 | Temp 98.0°F | Ht 59.0 in | Wt 104.4 lb

## 2019-08-24 DIAGNOSIS — E0500 Thyrotoxicosis with diffuse goiter without thyrotoxic crisis or storm: Secondary | ICD-10-CM

## 2019-08-24 LAB — T4, FREE: Free T4: 0.71 ng/dL (ref 0.60–1.60)

## 2019-08-24 LAB — TSH: TSH: 2.69 u[IU]/mL (ref 0.35–4.50)

## 2019-08-24 NOTE — Progress Notes (Signed)
Name: Tammy [name]  MRN/ DOB: [id number], 10/06/1955    Age/ Sex: 64 y.o., female     PCP: Rusty Aus, MD   Reason for Endocrinology Evaluation: Hyperthyroidism     Initial Endocrinology Clinic Visit: 05/18/18    PATIENT IDENTIFIER: [name] is a 63 y.o., female with a past medical history of Myasthenia Gravis  . She has followed with [place] [place] [facility] since 05/18/18 for consultative assistance with management of her low TSH .   HISTORICAL SUMMARY:  Pt was found to have hyperthyroidism on routine labs in 03/2018, with a TSH of 0.008 uIU/mL and elevated T4 of 2.45 ng/dL.    In November, 2019 she was diagnosed with Myasthenia Gravis, she was initially on on Cellcept, Mestinon and prednisone. Prednisone was tapered off fol lowed by mestinon and currently  On cellcept.  By the time of her presentation to our clinic, her FT4 have normalized and TSH was improving so we opted not to start any thionamide therapy at the time .  Methimazole was started in 07/2018 with a TSH of < 0.006 uIU/mL and elevated FT4 at 2.99  SUBJECTIVE:   During last visit (02/24/19): We reduce methimazole dose to 5 mg daily.    Today (08/24/2019):  Tammy [name] is here for a follow up on her graves's disease.  She has not been seen since August 2020   She is here today complaining of thinning of nails and hair loss.  She believes this may be related to her thyroid, patient also has been noted with weight loss.   She has been on methimazole since 07/2018.  She continues to take the 10 mg tablets breaking them into quarters  Took them  last infusion of Tepezza in 06/2019   Denies constipation  She is on anxiety medications citalopram which has helped tremendously Denies local neck enlargement.     ROS:  As per HPI.   HISTORY:  Past Medical History:  Past Medical History:  Diagnosis Date  . Environmental allergies   . Headache(784.0)    last one 1 year ago  . Migraines     . PONV (postoperative nausea and vomiting)    Past Surgical History:  Past Surgical History:  Procedure Laterality Date  . ABDOMINAL HYSTERECTOMY  1980's   total  . ANTERIOR CERVICAL DECOMP/DISCECTOMY FUSION  07/29/2011   Procedure: ANTERIOR CERVICAL DECOMPRESSION/DISCECTOMY FUSION 3 LEVEL/HARDWARE REMOVAL;  Surgeon: Ophelia Charter, MD;  Location: [place] NEURO ORS;  Service: Neurosurgery;  Laterality: Bilateral;  Cervical four-five, five-six, six-seven anterior cervical decompresion with fusion, interbody prothesis, plating and bonegraft  . HIP SURGERY  2006   repair post injury  . [facility] SINUS  2005    Social History:  reports that she has quit smoking. Her smoking use included cigarettes. She has a 10.00 pack-year smoking history. She has never used smokeless tobacco. She reports current alcohol use. She reports that she does not use drugs.  Family History: family history includes COPD in her mother.       OBJECTIVE:   PHYSICAL EXAM: VS: BP 104/60 (BP Location: Left Arm, Patient Position: Sitting, Cuff Size: Normal)   Pulse (!) 59   Temp 98 F (36.7 C)   Ht 4\' 11"  (1.499 m)   Wt 104 lb 6.4 oz (47.4 kg)   SpO2 98%   BMI 21.09 kg/m    EXAM: General: Pt appears well and is in NAD  Neck: General: Supple without adenopathy. Thyroid: Thyroid size  normal.   Lungs: Clear with good BS bilat with no rales, rhonchi, or wheezes  Heart: Auscultation: RRR.  Extremities:  BL LE: No pretibial edema normal ROM and strength.  Skin:  Patient noted with thinning of the cuticles and irregular nail tip.  Mental Status: Judgment, insight: Intact Mood and affect: No depression, anxiety, or agitation     DATA REVIEWED:  Results for Tammy, [name] (MRN [id number]) as of 06/24/2018 07:55  Ref. Range 05/18/2018 15:26  Thyrotropin Receptor Ab Latest Ref Range: <=16.0 % 23.9 (H)    ASSESSMENT / PLAN / RECOMMENDATIONS:   1. Hyperthyroidism Secondary to Graves' Disease:    Pt with  multiple non-specific symptoms that are NOT attributed to her thyroid, since her TFTs are normal.  Denies any local neck symptoms.   Repeat TFT's today are normal, but will reduce her methimazole dose to preemptively avoid hypothyroidism   Medications Methimazole 5 mg,2.5 mg daily except Sundays     2. Graves' Disease :  - Pt with graves' orbitopathy, she completed tepezza in 06/2019  3.  Hair thinning/nail thinning  -She is already using topical nail polish for cuticle thickening. -She is wondering she can use biotin, I have advised her that it is okay to use biotin as long as she remembers to hold it 3 days prior to any thyroid checks.   F/u in 4 months    Addendum: A letter will be sent to the patient with new recommendations  Signed electronically by: Mack Guise, MD  [facility] Endocrinology  [facility] [address] [place], [place] [address] Phone: [phone] FAX: [phone]      CC: Rusty Aus, [place] [address] Phone: [phone]  Fax: [phone]   Return to Endocrinology clinic as below: Future Appointments  Date Time Provider [place]  09/11/2019  8:00 AM Marcial Pacas, MD [facility] None  12/22/2019  7:30 AM [place], Melanie Crazier, MD [facility] None

## 2019-08-25 ENCOUNTER — Ambulatory Visit: Payer: BC Managed Care – PPO | Admitting: Internal Medicine

## 2019-08-28 ENCOUNTER — Ambulatory Visit: Payer: BC Managed Care – PPO | Admitting: Internal Medicine

## 2019-08-28 DIAGNOSIS — K120 Recurrent oral aphthae: Secondary | ICD-10-CM | POA: Diagnosis not present

## 2019-09-01 ENCOUNTER — Telehealth: Payer: Self-pay | Admitting: Internal Medicine

## 2019-09-01 ENCOUNTER — Other Ambulatory Visit: Payer: Self-pay | Admitting: Internal Medicine

## 2019-09-01 ENCOUNTER — Other Ambulatory Visit: Payer: Self-pay

## 2019-09-01 MED ORDER — METHIMAZOLE 5 MG PO TABS: 2.5000 mg | ORAL_TABLET | Freq: Every day | ORAL | 1 refills | Status: DC

## 2019-09-01 NOTE — Telephone Encounter (Signed)
MEDICATION: methimazole  PHARMACY:   [place], [place] Phone:  [phone]  Fax:  [phone]     IS THIS A 90 DAY SUPPLY : yes  IS PATIENT OUT OF MEDICATION: yes  IF NOT; HOW MUCH IS LEFT:   LAST APPOINTMENT DATE: 08/24/2019  NEXT APPOINTMENT DATE: 12/22/2019  DO WE HAVE YOUR PERMISSION TO LEAVE A DETAILED MESSAGE: yes  OTHER COMMENTS:    **Let patient know to contact pharmacy at the end of the day to make sure medication is ready. **  ** Please notify patient to allow 48-72 hours to process**  **Encourage patient to contact the pharmacy for refills or they can request refills through [facility]**

## 2019-09-06 DIAGNOSIS — G7000 Myasthenia gravis without (acute) exacerbation: Secondary | ICD-10-CM | POA: Diagnosis not present

## 2019-09-06 DIAGNOSIS — E0500 Thyrotoxicosis with diffuse goiter without thyrotoxic crisis or storm: Secondary | ICD-10-CM | POA: Diagnosis not present

## 2019-09-06 DIAGNOSIS — E782 Mixed hyperlipidemia: Secondary | ICD-10-CM | POA: Diagnosis not present

## 2019-09-06 DIAGNOSIS — M5090 Cervical disc disorder, unspecified, unspecified cervical region: Secondary | ICD-10-CM | POA: Diagnosis not present

## 2019-09-11 ENCOUNTER — Other Ambulatory Visit: Payer: Self-pay

## 2019-09-11 ENCOUNTER — Ambulatory Visit (INDEPENDENT_AMBULATORY_CARE_PROVIDER_SITE_OTHER): Payer: BC Managed Care – PPO | Admitting: Neurology

## 2019-09-11 ENCOUNTER — Encounter: Payer: Self-pay | Admitting: Neurology

## 2019-09-11 VITALS — BP 110/68 | HR 56 | Temp 97.1°F | Ht 59.0 in | Wt 103.0 lb

## 2019-09-11 DIAGNOSIS — G7000 Myasthenia gravis without (acute) exacerbation: Secondary | ICD-10-CM | POA: Diagnosis not present

## 2019-09-11 DIAGNOSIS — R202 Paresthesia of skin: Secondary | ICD-10-CM | POA: Diagnosis not present

## 2019-09-11 MED ORDER — GABAPENTIN 300 MG PO CAPS: 300.0000 mg | ORAL_CAPSULE | Freq: Two times a day (BID) | ORAL | 4 refills | Status: DC

## 2019-09-11 MED ORDER — MYCOPHENOLATE MOFETIL 500 MG PO TABS: 500.0000 mg | ORAL_TABLET | Freq: Two times a day (BID) | ORAL | 4 refills | Status: DC

## 2019-09-11 NOTE — Progress Notes (Signed)
PATIENT: Tammy [name] DOB: [birth year]  Chief Complaint  Patient presents with  . Myasthenia Gravis    She is here for her yearly follow up. No new concerns. Feels she is doing well.      HISTORICAL  Tammy [name] is a 64 years old female, seen in request by her ophthalmologist Dr. Manuella Ghazi, [place] for evaluation of double vision, initial evaluation was on May 03, 2018.  I have reviewed and summarized the referring note from the referring physician dated April 08, 2018, she was evaluated by her ophthalmologist Dr. 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  Without clear trigger event, patient noticed mild blurry vision when she is driving since September 2019, later she was able to discern it was double vision, double vision gets better closing either left or right eye, the position of the double vision changes, if she closed one eye, the double vision might improve, her double vision also varies, more obvious when she gets tired, and at the end of the day.  She had a history of sinus surgery in [id number], which is complicated by periorbital infection, misalignment of her vision, which lasted for 3 to 4 months, during that period of time, she did have binocular double vision,  She denies swallowing difficulty, denies limb muscle weakness, denies chewing difficulty,  Recently she also had weight loss.  TSH was significantly decreased 0.008 with elevated free T36.2, free T4 2.45 normal CBC hemoglobin of 14.4, CMP, LDL was 100, cholesterol was 187  Laboratory evaluation on May 09, 2018 showed positive acetylcholine binding antibody, with titer of 1.62, blocking antibody 40,  UPDATE May 31 2018: I personally reviewed MRI report May 18, 2018 that was normal.  Ct chest in November 2019, no evidence of thymoma, diffuse bronchial wall thickening with mild to moderate  centrilobular and paraseptal emphysema, consistent with underlying COPD, groundglass attenuation nodule, largest at the left upper lobe 9 x 12 mm,  She has been taking tapering dose of prednisone, now taking prednisone 5mg  3 tabs a day x 2 weeks, and also cellcept 500mg  2 tabs bid, tolerating it well, with mild side effect of acid reflux,  She has not taking Mestinon yet  UPDATE September 11 2019: She has lost follow-up since last visit in December 2019, continue take CellCept 500 mg 2 tablets twice a day, no longer taking Mestinon,  She is under endocrinologist Dr.Shamleffer care, reviewed most recent note on August 24, 2019, hyperthyroidism, Graves' disease, was treated with methimazole since February 2020, infusion of Tepezza in January 2021  Her myasthenia gravis has been under good control, no ptosis, she does complains of fatigue, since better control of her hyperthyroid disease, she no longer has double vision, she denies difficulty swallowing, or limb muscle weakness, she has not been taking Mestinon, at her worst, she described worsening fatigue, double vision, difficulty getting up from squatting down position  REVIEW OF SYSTEMS: Full 14 system review of systems performed and notable only for as above  ALLERGIES: Allergies  Allergen Reactions  . Sulfa Antibiotics Hives  . Molds & Smuts     HOME MEDICATIONS: Current Outpatient Medications  Medication Sig Dispense Refill  . acetaminophen (TYLENOL) 500 MG tablet Take 1,000 mg by mouth every 6 (six) hours as needed for headache.    . anti-nausea (EMETROL) solution Take 10 mLs by mouth every 15 (fifteen) minutes as needed for  nausea or vomiting.    Marland Kitchen BREO ELLIPTA 100-25 MCG/INH AEPB Inhale 1 puff into the lungs daily.  3  . estradiol (VIVELLE-DOT) 0.1 MG/24HR Place 1 patch onto the skin 2 (two) times a week.    . fluticasone (VERAMYST) 27.5 MCG/SPRAY nasal spray Place 2 sprays into the nose daily as needed. For allergies    .  gabapentin (NEURONTIN) 300 MG capsule Take 1 capsule (300 mg total) by mouth 3 (three) times daily. Please call [phone] to schedule an appointment. 90 capsule 2  . HYDROcodone-acetaminophen (NORCO/VICODIN) 5-325 MG tablet Take 1 tablet by mouth every 6 (six) hours as needed for moderate pain.    . hydrOXYzine (ATARAX/VISTARIL) 25 MG tablet Take 25 mg by mouth as needed for itching.     . methimazole (TAPAZOLE) 5 MG tablet Take 0.5 tablets (2.5 mg total) by mouth daily. 45 tablet 1  . montelukast (SINGULAIR) 10 MG tablet Take 10 mg by mouth at bedtime.    . mycophenolate (CELLCEPT) 500 MG tablet TAKE 2 TABLETS (1,000 MG) BY MOUTH TWICE DAILY 120 tablet 1  . OXYBUTYNIN CHLORIDE PO     . Polyethyl Glycol-Propyl Glycol (SYSTANE) 0.4-0.3 % GEL ophthalmic gel Place 1 application into both eyes 3 (three) times daily.    . Probiotic Product (PRO-BIOTIC BLEND PO) Take by mouth.     No current facility-administered medications for this visit.    PAST MEDICAL HISTORY: Past Medical History:  Diagnosis Date  . Environmental allergies   . Headache(784.0)    last one 1 year ago  . Migraines   . PONV (postoperative nausea and vomiting)     PAST SURGICAL HISTORY: Past Surgical History:  Procedure Laterality Date  . ABDOMINAL HYSTERECTOMY  1980's   total  . ANTERIOR CERVICAL DECOMP/DISCECTOMY FUSION  07/29/2011   Procedure: ANTERIOR CERVICAL DECOMPRESSION/DISCECTOMY FUSION 3 LEVEL/HARDWARE REMOVAL;  Surgeon: Ophelia Charter, MD;  Location: [facility];  Service: Neurosurgery;  Laterality: Bilateral;  Cervical four-five, five-six, six-seven anterior cervical decompresion with fusion, interbody prothesis, plating and bonegraft  . HIP SURGERY  2006   repair post injury  . [facility] SINUS  2005    FAMILY HISTORY: Family History  Problem Relation Age of Onset  . COPD Mother   . Anesthesia problems Neg Hx     SOCIAL HISTORY: Social History   Socioeconomic History  . Marital status: Married     Spouse name: Not on file  . Number of children: Not on file  . Years of education: 42  . Highest education level: Bachelor's degree (e.g., BA, AB, BS)  Occupational History  . Not on file  Tobacco Use  . Smoking status: Former Smoker    Packs/day: 0.50    Years: 20.00    Pack years: 10.00    Types: Cigarettes  . Smokeless tobacco: Never Used  Substance and Sexual Activity  . Alcohol use: Yes    Comment: 6 times a year   . Drug use: Never  . Sexual activity: Not on file  Other Topics Concern  . Not on file  Social History Narrative   Lives at home with husband   Left handed   Caffeine: daily, 2 cups    Social Determinants of Health   Financial Resource Strain:   . Difficulty of Paying Living Expenses:   Food Insecurity:   . Worried About Charity fundraiser in the Last Year:   . Arboriculturist in the Last Year:   Transportation Needs:   .  Lack of Transportation (Medical):   Marland Kitchen Lack of Transportation (Non-Medical):   Physical Activity:   . Days of Exercise per Week:   . Minutes of Exercise per Session:   Stress:   . Feeling of Stress :   Social Connections:   . Frequency of Communication with Friends and Family:   . Frequency of Social Gatherings with Friends and Family:   . Attends Religious Services:   . Active Member of Clubs or Organizations:   . Attends Archivist Meetings:   Marland Kitchen Marital Status:   Intimate Partner Violence:   . Fear of Current or Ex-Partner:   . Emotionally Abused:   Marland Kitchen Physically Abused:   . Sexually Abused:      PHYSICAL EXAM   Vitals:   09/11/19 0801  BP: 110/68  Pulse: (!) 56  Temp: (!) 97.1 F (36.2 C)  Weight: 103 lb (46.7 kg)  Height: 4\' 11"  (1.499 m)    Not recorded      Body mass index is 20.8 kg/m.  PHYSICAL EXAMNIATION:  Gen: NAD, conversant, well nourised,  well groomed                     Cardiovascular: Regular rate rhythm, no peripheral edema, warm, nontender. Eyes: Conjunctivae clear without  exudates or hemorrhage Neck: Supple, no carotid bruits. Pulmonary: Clear to auscultation bilaterally   NEUROLOGICAL EXAM:  MENTAL STATUS: Speech:    Speech is normal; fluent and spontaneous with normal comprehension.  Cognition:     Orientation to time, place and person     Normal recent and remote memory     Normal Attention span and concentration     Normal Language, naming, repeating,spontaneous speech     Fund of knowledge   CRANIAL NERVES: CN II: Visual fields are full to confrontation.  Pupils are round equal and briskly reactive to light. CN III, IV, VI: extraocular movement are normal. No ptosis.  CN V: Facial sensation is intact  CN VII: Face is symmetric with normal eye closure and smile. CN VIII: Hearing is normal to rubbing fingers CN IX, X: Palate elevates symmetrically. Phonation is normal. CN XI: Head turning and shoulder shrug are intact CN XII: Tongue is midline with normal movements and no atrophy.  MOTOR: There is no pronator drift of out-stretched arms. Muscle bulk and tone are normal. Muscle strength is normal.  REFLEXES: Reflexes are 2+ and symmetric at the biceps, triceps, knees, and ankles. Plantar responses are flexor.  SENSORY: Intact to light touch, pinprick, positional sensation and vibratory sensation are intact in fingers and toes.  COORDINATION: Rapid alternating movements and fine finger movements are intact. There is no dysmetria on finger-to-nose and heel-knee-shin.    GAIT/STANCE: Posture is normal. Gait is steady with normal steps, base, arm swing, and turning. Heel and toe walking are normal. Tandem gait is normal.  Romberg is absent.   DIAGNOSTIC DATA (LABS, IMAGING, TESTING) - I reviewed patient records, labs, notes, testing and imaging myself where available.   ASSESSMENT AND PLAN  Tammy [name] is a 64 y.o. female   Seropositive ocular myasthenia gravis  Confirmed by positive acetylcholine binding antibody   MRI of the  brain was normal  CT of the chest no evidence ofthymus pathology  And has been under good control since her treatment of Graves' disease, will decrease CellCept to 500 mg twice a day  Laboratory evaluations, CMP CBC,  Graves' disease, hyperthyroidism  Is under the care of  endocrinologist Dr.Shamleffer  TSH, free T4 on August 24, 2019.   Tammy [name], M.D. Ph.D.  [facility] [address] [place],  [address] Ph: [phone] Fax: [phone]  CC: Danice Goltz, MD

## 2019-09-12 ENCOUNTER — Telehealth: Payer: Self-pay | Admitting: *Deleted

## 2019-09-12 LAB — COMPREHENSIVE METABOLIC PANEL
ALT: 9 IU/L (ref 0–32)
AST: 14 IU/L (ref 0–40)
Albumin/Globulin Ratio: 2.4 — ABNORMAL HIGH (ref 1.2–2.2)
Albumin: 4.4 g/dL (ref 3.8–4.8)
Alkaline Phosphatase: 51 IU/L (ref 39–117)
BUN/Creatinine Ratio: 16 (ref 12–28)
BUN: 14 mg/dL (ref 8–27)
Bilirubin Total: 0.3 mg/dL (ref 0.0–1.2)
CO2: 25 mmol/L (ref 20–29)
Calcium: 8.9 mg/dL (ref 8.7–10.3)
Chloride: 102 mmol/L (ref 96–106)
Creatinine, Ser: 0.87 mg/dL (ref 0.57–1.00)
GFR calc Af Amer: 83 mL/min/{1.73_m2} (ref >59)
GFR calc non Af Amer: 72 mL/min/{1.73_m2} (ref >59)
Globulin, Total: 1.8 g/dL (ref 1.5–4.5)
Glucose: 86 mg/dL (ref 65–99)
Potassium: 4.2 mmol/L (ref 3.5–5.2)
Sodium: 139 mmol/L (ref 134–144)
Total Protein: 6.2 g/dL (ref 6.0–8.5)

## 2019-09-12 LAB — CBC WITH DIFFERENTIAL/PLATELET
Basophils Absolute: 0 10*3/uL (ref 0.0–0.2)
Basos: 1 %
EOS (ABSOLUTE): 0.1 10*3/uL (ref 0.0–0.4)
Eos: 2 %
Hematocrit: 39.3 % (ref 34.0–46.6)
Hemoglobin: 12.9 g/dL (ref 11.1–15.9)
Immature Grans (Abs): 0 10*3/uL (ref 0.0–0.1)
Immature Granulocytes: 0 %
Lymphocytes Absolute: 1.5 10*3/uL (ref 0.7–3.1)
Lymphs: 31 %
MCH: 29.5 pg (ref 26.6–33.0)
MCHC: 32.8 g/dL (ref 31.5–35.7)
MCV: 90 fL (ref 79–97)
Monocytes Absolute: 0.4 10*3/uL (ref 0.1–0.9)
Monocytes: 7 %
Neutrophils Absolute: 2.9 10*3/uL (ref 1.4–7.0)
Neutrophils: 59 %
Platelets: 226 10*3/uL (ref 150–450)
RBC: 4.38 x10E6/uL (ref 3.77–5.28)
RDW: 12.5 % (ref 11.7–15.4)
WBC: 4.9 10*3/uL (ref 3.4–10.8)

## 2019-09-12 NOTE — Telephone Encounter (Signed)
-----   Message from Marcial Pacas, MD sent at 09/12/2019  1:53 PM EDT ----- Please call patient for normal laboratory result

## 2019-09-12 NOTE — Telephone Encounter (Signed)
I spoke to the patient and she has been informed of her lab results.

## 2019-09-13 ENCOUNTER — Emergency Department
Admission: EM | Admit: 2019-09-13 | Discharge: 2019-09-13 | Disposition: A | Discharge status: Home / Self Care | Payer: BC Managed Care – PPO | Attending: Emergency Medicine | Admitting: Emergency Medicine

## 2019-09-13 ENCOUNTER — Other Ambulatory Visit: Payer: Self-pay

## 2019-09-13 ENCOUNTER — Encounter: Payer: Self-pay | Admitting: Emergency Medicine

## 2019-09-13 DIAGNOSIS — Z87891 Personal history of nicotine dependence: Secondary | ICD-10-CM | POA: Diagnosis not present

## 2019-09-13 DIAGNOSIS — Z79899 Other long term (current) drug therapy: Secondary | ICD-10-CM | POA: Diagnosis not present

## 2019-09-13 DIAGNOSIS — Z20822 Contact with and (suspected) exposure to covid-19: Secondary | ICD-10-CM | POA: Insufficient documentation

## 2019-09-13 DIAGNOSIS — R52 Pain, unspecified: Secondary | ICD-10-CM | POA: Diagnosis not present

## 2019-09-13 DIAGNOSIS — K529 Noninfective gastroenteritis and colitis, unspecified: Secondary | ICD-10-CM | POA: Diagnosis not present

## 2019-09-13 DIAGNOSIS — A084 Viral intestinal infection, unspecified: Secondary | ICD-10-CM | POA: Diagnosis not present

## 2019-09-13 DIAGNOSIS — R112 Nausea with vomiting, unspecified: Secondary | ICD-10-CM | POA: Diagnosis not present

## 2019-09-13 DIAGNOSIS — R110 Nausea: Secondary | ICD-10-CM | POA: Diagnosis not present

## 2019-09-13 DIAGNOSIS — R1111 Vomiting without nausea: Secondary | ICD-10-CM | POA: Diagnosis not present

## 2019-09-13 DIAGNOSIS — R064 Hyperventilation: Secondary | ICD-10-CM | POA: Diagnosis not present

## 2019-09-13 DIAGNOSIS — R000 Tachycardia, unspecified: Secondary | ICD-10-CM | POA: Diagnosis not present

## 2019-09-13 HISTORY — DX: Thyrotoxicosis with diffuse goiter without thyrotoxic crisis or storm: E05.00

## 2019-09-13 HISTORY — DX: Irritable bowel syndrome without diarrhea: K58.9

## 2019-09-13 LAB — COMPREHENSIVE METABOLIC PANEL
ALT: 16 U/L (ref 0–44)
AST: 19 U/L (ref 15–41)
Albumin: 4.1 g/dL (ref 3.5–5.0)
Alkaline Phosphatase: 39 U/L (ref 38–126)
Anion gap: 7 (ref 5–15)
BUN: 14 mg/dL (ref 8–23)
CO2: 23 mmol/L (ref 22–32)
Calcium: 8.2 mg/dL — ABNORMAL LOW (ref 8.9–10.3)
Chloride: 110 mmol/L (ref 98–111)
Creatinine, Ser: 0.76 mg/dL (ref 0.44–1.00)
GFR calc Af Amer: >60 mL/min (ref >60)
GFR calc non Af Amer: >60 mL/min (ref >60)
Glucose, Bld: 195 mg/dL — ABNORMAL HIGH (ref 70–99)
Potassium: 4.1 mmol/L (ref 3.5–5.1)
Sodium: 140 mmol/L (ref 135–145)
Total Bilirubin: 0.9 mg/dL (ref 0.3–1.2)
Total Protein: 6.4 g/dL — ABNORMAL LOW (ref 6.5–8.1)

## 2019-09-13 LAB — CBC WITH DIFFERENTIAL/PLATELET
Abs Immature Granulocytes: 0.06 10*3/uL (ref 0.00–0.07)
Basophils Absolute: 0 10*3/uL (ref 0.0–0.1)
Basophils Relative: 0 %
Eosinophils Absolute: 0.1 10*3/uL (ref 0.0–0.5)
Eosinophils Relative: 1 %
HCT: 39.3 % (ref 36.0–46.0)
Hemoglobin: 12.8 g/dL (ref 12.0–15.0)
Immature Granulocytes: 1 %
Lymphocytes Relative: 4 %
Lymphs Abs: 0.5 10*3/uL — ABNORMAL LOW (ref 0.7–4.0)
MCH: 29.1 pg (ref 26.0–34.0)
MCHC: 32.6 g/dL (ref 30.0–36.0)
MCV: 89.3 fL (ref 80.0–100.0)
Monocytes Absolute: 0.4 10*3/uL (ref 0.1–1.0)
Monocytes Relative: 3 %
Neutro Abs: 11.2 10*3/uL — ABNORMAL HIGH (ref 1.7–7.7)
Neutrophils Relative %: 91 %
Platelets: 229 10*3/uL (ref 150–400)
RBC: 4.4 MIL/uL (ref 3.87–5.11)
RDW: 12 % (ref 11.5–15.5)
WBC: 12.3 10*3/uL — ABNORMAL HIGH (ref 4.0–10.5)
nRBC: 0 % (ref 0.0–0.2)

## 2019-09-13 LAB — SARS CORONAVIRUS 2 (TAT 6-24 HRS): SARS Coronavirus 2: NEGATIVE

## 2019-09-13 MED ORDER — ONDANSETRON 4 MG PO TBDP: 4.0000 mg | ORAL_TABLET | Freq: Three times a day (TID) | ORAL | 0 refills | Status: AC | PRN

## 2019-09-13 MED ORDER — LACTATED RINGERS IV BOLUS
1000.0000 mL | Freq: Once | INTRAVENOUS | Status: AC
  Administered 2019-09-13: 1000 mL via INTRAVENOUS

## 2019-09-13 MED ORDER — METOCLOPRAMIDE HCL 5 MG/ML IJ SOLN
10.0000 mg | Freq: Once | INTRAMUSCULAR | Status: AC
  Administered 2019-09-13: 10 mg via INTRAVENOUS
  Filled 2019-09-13: qty 2

## 2019-09-13 MED ORDER — ONDANSETRON HCL 4 MG/2ML IJ SOLN
INTRAMUSCULAR | Status: AC
  Filled 2019-09-13: qty 2

## 2019-09-13 MED ORDER — SODIUM CHLORIDE 0.9 % IV BOLUS
1000.0000 mL | Freq: Once | INTRAVENOUS | Status: AC
  Administered 2019-09-13: 1000 mL via INTRAVENOUS

## 2019-09-13 MED ORDER — ONDANSETRON HCL 4 MG/2ML IJ SOLN
4.0000 mg | Freq: Once | INTRAMUSCULAR | Status: AC
  Administered 2019-09-13: 4 mg via INTRAVENOUS

## 2019-09-13 NOTE — ED Provider Notes (Addendum)
[facility] Emergency Department Provider Note  ____________________________________________  Time seen: Approximately 4:37 AM  I have reviewed the triage vital signs and the nursing notes.   HISTORY  Chief Complaint Abdominal Pain   HPI Tammy [name] is a 64 y.o. female with a history of Graves' disease, myasthenia gravis, IBS, migraine headaches who presents for evaluation of vomiting and diarrhea.  Patient reports that her symptoms started around 10 PM.  She had dinner at 6 PM with her husband but he did not get sick.  She reports having numerous episodes of nonbloody nonbilious emesis and watery diarrhea.  No abdominal pain, no fever but she has had chills.  She is complaining of diffuse body cramping.  No headache, cough, chest pain or shortness of breath, no known exposures to Covid.  No melena, hematemesis, hematochezia, coffee-ground emesis.   Past Medical History:  Diagnosis Date  . Environmental allergies   . Graves disease   . Headache(784.0)    last one 1 year ago  . IBS (irritable bowel syndrome)   . Migraines   . PONV (postoperative nausea and vomiting)     Patient Active Problem List   Diagnosis Date Noted  . Paresthesia 09/11/2019  . Muscle cramps 02/24/2019  . Graves disease 11/30/2018  . Abnormal TSH 05/19/2018  . Hyperthyroidism 05/03/2018  . Myasthenia gravis ([place]) 05/03/2018  . Diplopia 05/03/2018    Past Surgical History:  Procedure Laterality Date  . ABDOMINAL HYSTERECTOMY  1980's   total  . ANTERIOR CERVICAL DECOMP/DISCECTOMY FUSION  07/29/2011   Procedure: ANTERIOR CERVICAL DECOMPRESSION/DISCECTOMY FUSION 3 LEVEL/HARDWARE REMOVAL;  Surgeon: Ophelia Charter, MD;  Location: [facility];  Service: Neurosurgery;  Laterality: Bilateral;  Cervical four-five, five-six, six-seven anterior cervical decompresion with fusion, interbody prothesis, plating and bonegraft  . HIP SURGERY  2006   repair post injury  . [facility] SINUS   2005    Prior to Admission medications   Medication Sig Start Date End Date Taking? Authorizing Provider  acetaminophen (TYLENOL) 500 MG tablet Take 1,000 mg by mouth every 6 (six) hours as needed for headache.    [provider]  anti-nausea (EMETROL) solution Take 10 mLs by mouth every 15 (fifteen) minutes as needed for nausea or vomiting.    [provider]  BREO ELLIPTA 100-25 MCG/INH AEPB Inhale 1 puff into the lungs daily. 04/08/18   [provider]  estradiol (VIVELLE-DOT) 0.1 MG/24HR Place 1 patch onto the skin 2 (two) times a week.    [provider]  fluticasone (VERAMYST) 27.5 MCG/SPRAY nasal spray Place 2 sprays into the nose daily as needed. For allergies    [provider]  gabapentin (NEURONTIN) 300 MG capsule Take 1 capsule (300 mg total) by mouth 2 (two) times daily. 09/11/19   Marcial Pacas, MD  HYDROcodone-acetaminophen (NORCO/VICODIN) 5-325 MG tablet Take 1 tablet by mouth every 6 (six) hours as needed for moderate pain.    [provider]  hydrOXYzine (ATARAX/VISTARIL) 25 MG tablet Take 25 mg by mouth as needed for itching.     [provider]  methimazole (TAPAZOLE) 5 MG tablet Take 0.5 tablets (2.5 mg total) by mouth daily. 09/01/19   Shamleffer, Melanie Crazier, MD  montelukast (SINGULAIR) 10 MG tablet Take 10 mg by mouth at bedtime.    [provider]  mycophenolate (CELLCEPT) 500 MG tablet Take 1 tablet (500 mg total) by mouth 2 (two) times daily. 09/11/19   Marcial Pacas, MD  ondansetron [facility]  ODT) 4 MG disintegrating tablet Take 1 tablet (4 mg total) by mouth every 8 (eight) hours as needed. 09/13/19   Rudene Re, MD  OXYBUTYNIN CHLORIDE PO  09/27/18   [provider]  Polyethyl Glycol-Propyl Glycol (SYSTANE) 0.4-0.3 % GEL ophthalmic gel Place 1 application into both eyes 3 (three) times daily.    [provider]  Probiotic Product (PRO-BIOTIC BLEND PO) Take by mouth.    [provider]    Allergies Sulfa antibiotics and Molds & smuts  Family History  Problem Relation Age of Onset  . COPD Mother   . Anesthesia problems Neg Hx     Social History Social History   Tobacco Use  . Smoking status: Former Smoker    Packs/day: 0.50    Years: 20.00    Pack years: 10.00    Types: Cigarettes  . Smokeless tobacco: Never Used  Substance Use Topics  . Alcohol use: Yes    Comment: 6 times a year   . Drug use: Never    Review of Systems  Constitutional: Negative for fever. + chills Eyes: Negative for visual changes. ENT: Negative for sore throat. Neck: No neck pain  Cardiovascular: Negative for chest pain. Respiratory: Negative for shortness of breath. Gastrointestinal: Negative for abdominal pain. + vomiting and diarrhea. Genitourinary: Negative for dysuria. Musculoskeletal: Negative for back pain. Skin: Negative for rash. Neurological: Negative for headaches, weakness or numbness. Psych: No SI or HI  ____________________________________________   PHYSICAL EXAM:  VITAL SIGNS: ED Triage Vitals [09/13/19 0343]  Enc Vitals Group     BP 110/61     Pulse Rate 77     Resp 20     Temp (!) 97.3 F (36.3 C)     Temp Source Oral     SpO2 100 %     Weight 103 lb (46.7 kg)     Height 4\' 10"  (1.473 m)     Head Circumference      Peak Flow      Pain Score 6     Pain Loc      Pain Edu?      Excl. in [place]?     Constitutional: Alert and oriented, shivering but in apparent distress. HEENT:      Head: Normocephalic and atraumatic.         Eyes: Conjunctivae are normal. Sclera is non-icteric.       Mouth/Throat: Mucous membranes are moist.       Neck: Supple with no signs of meningismus. Cardiovascular: Regular rate and rhythm. No murmurs, gallops, or rubs. 2+ symmetrical distal pulses are present in all extremities.  Respiratory: Normal respiratory effort. Lungs are clear to auscultation bilaterally. No wheezes, crackles, or rhonchi.    Gastrointestinal: Soft, non tender, and non distended with positive bowel sounds. No rebound or guarding. Genitourinary: No CVA tenderness. Musculoskeletal:  No edema, cyanosis, or erythema of extremities. Neurologic: Normal speech and language. Face is symmetric. Moving all extremities. No gross focal neurologic deficits are appreciated. Skin: Skin is warm, dry and intact. No rash noted. Psychiatric: Mood and affect are normal. Speech and behavior are normal.  ____________________________________________   LABS (all labs ordered are listed, but only abnormal results are displayed)  Labs Reviewed  CBC WITH DIFFERENTIAL/PLATELET - Abnormal; Notable for the following components:      Result Value   WBC 12.3 (*)    Neutro Abs 11.2 (*)    Lymphs Abs 0.5 (*)    All other components within  normal limits  COMPREHENSIVE METABOLIC PANEL - Abnormal; Notable for the following components:   Glucose, Bld 195 (*)    Calcium 8.2 (*)    Total Protein 6.4 (*)    All other components within normal limits  SARS CORONAVIRUS 2 (TAT 6-24 HRS)   ____________________________________________  EKG  ED ECG REPORT I, Rudene Re, the attending physician, personally viewed and interpreted this ECG.  Normal sinus rhythm, rate of 78, normal intervals, normal axis, no ST elevation or depression.  Limited evaluation due to artifact from shivering ____________________________________________  RADIOLOGY  none  ____________________________________________   PROCEDURES  Procedure(s) performed: None Procedures Critical Care performed:  None ____________________________________________   INITIAL IMPRESSION / ASSESSMENT AND PLAN / ED COURSE   64 y.o. female with a history of Graves' disease, myasthenia gravis, IBS, migraine headaches who presents for evaluation of several episodes of NBNB emesis and watery diarrhea.  Patient is shivering but afebrile with otherwise normal vitals, abdomen is soft  with no distention, no tenderness and positive bowel sounds.  Patient does not look dehydrated.  Differential diagnosis including viral gastroenteritis versus Covid versus food poisoning versus colitis versus gastritis.  We will check for Covid, will check labs for any signs of dehydration, AKI, significant electrolyte abnormalities.  Will give IV fluids and Zofran.  _________________________ 6:26 AM on 09/13/2019 -----------------------------------------  As part of my medical decision making, I reviewed the following data within the [facility] . I have reviewed the EKG and looked at the rhythm strip in the room which showed NSR. I have reviewed patient's previous medical records and PMH. Labs were reviewed by me and showing mild leukocytosis consistent with a viral gastroenteritis but no significant electrolyte abnormalities or AKI. Mild hyperglycemia with no history of DM, discussed this finding with patient and recommended fasting BG evaluation by PCP once patient is feeling better. IV antiemetics and fluids were given and patient feels markedly improved.  No episodes of diarrhea in the emergency room.  Patient is tolerating p.o.  Will discharge home on a bland diet, Zofran, increase oral hydration and follow-up with PCP.  Discussed return precautions for any signs of dehydration, abdominal pain, fever.      _____________________________________________ Please note:  Patient was evaluated in Emergency Department today for the symptoms described in the history of present illness. Patient was evaluated in the context of the global COVID-19 pandemic, which necessitated consideration that the patient might be at risk for infection with the SARS-CoV-2 virus that causes COVID-19. Institutional protocols and algorithms that pertain to the evaluation of patients at risk for COVID-19 are in a state of rapid change based on information released by regulatory bodies including the CDC and  federal and state organizations. These policies and algorithms were followed during the patient's care in the ED.  Some ED evaluations and interventions may be delayed as a result of limited staffing during the pandemic.   ____________________________________________   FINAL CLINICAL IMPRESSION(S) / ED DIAGNOSES   Final diagnoses:  Nausea vomiting and diarrhea  Viral gastroenteritis      NEW MEDICATIONS STARTED DURING THIS VISIT:  ED Discharge Orders         Ordered    ondansetron (ZOFRAN ODT) 4 MG disintegrating tablet  Every 8 hours PRN     09/13/19 0629           Note:  This document was prepared using Dragon voice recognition software and may include unintentional dictation errors.    Rudene Re, MD  09/13/19 [place], [place], MD 09/13/19 [phone]

## 2019-09-13 NOTE — ED Notes (Signed)
Pt ambulatory to the bathroom at this time.

## 2019-09-13 NOTE — ED Triage Notes (Signed)
Pt in with co generalized abd pain, also co n/v/d that started yesterday after eating dinner. Pt has hx of IBS but states does not feel the same.

## 2019-09-22 ENCOUNTER — Other Ambulatory Visit: Payer: Self-pay | Admitting: Neurology

## 2019-09-27 ENCOUNTER — Other Ambulatory Visit: Payer: Self-pay | Admitting: Neurology

## 2019-11-10 DIAGNOSIS — H90A22 Sensorineural hearing loss, unilateral, left ear, with restricted hearing on the contralateral side: Secondary | ICD-10-CM | POA: Diagnosis not present

## 2019-11-10 DIAGNOSIS — H9122 Sudden idiopathic hearing loss, left ear: Secondary | ICD-10-CM | POA: Diagnosis not present

## 2019-11-20 ENCOUNTER — Other Ambulatory Visit: Payer: Self-pay | Admitting: Otolaryngology

## 2019-11-20 DIAGNOSIS — H9122 Sudden idiopathic hearing loss, left ear: Secondary | ICD-10-CM

## 2019-11-20 DIAGNOSIS — H903 Sensorineural hearing loss, bilateral: Secondary | ICD-10-CM | POA: Diagnosis not present

## 2019-11-24 ENCOUNTER — Other Ambulatory Visit: Payer: Self-pay | Admitting: Internal Medicine

## 2019-11-26 ENCOUNTER — Ambulatory Visit
Admission: RE | Admit: 2019-11-26 | Discharge: 2019-11-26 | Disposition: A | Discharge status: Home / Self Care | Payer: BC Managed Care – PPO | Source: Ambulatory Visit | Attending: Otolaryngology | Admitting: Otolaryngology

## 2019-11-26 ENCOUNTER — Other Ambulatory Visit: Payer: Self-pay

## 2019-11-26 DIAGNOSIS — H9122 Sudden idiopathic hearing loss, left ear: Secondary | ICD-10-CM

## 2019-11-26 DIAGNOSIS — H9192 Unspecified hearing loss, left ear: Secondary | ICD-10-CM | POA: Diagnosis not present

## 2019-11-26 MED ORDER — GADOBUTROL 1 MMOL/ML IV SOLN
5.0000 mL | Freq: Once | INTRAVENOUS | Status: AC | PRN
  Administered 2019-11-26: 5 mL via INTRAVENOUS

## 2019-12-03 ENCOUNTER — Ambulatory Visit: Admission: RE | Admit: 2019-12-03 | Payer: BC Managed Care – PPO | Source: Ambulatory Visit

## 2019-12-20 ENCOUNTER — Telehealth: Payer: Self-pay | Admitting: Internal Medicine

## 2019-12-20 NOTE — Telephone Encounter (Signed)
Please inform pt to keep appt.

## 2019-12-20 NOTE — Telephone Encounter (Signed)
Please advise 

## 2019-12-20 NOTE — Telephone Encounter (Signed)
Patient requests to be called at ph# [phone] re: Patient stopped taking Biotin on 12/19/19. Patient has an appointment with Dr. Kelton Pillar on 12/22/19 who usually has patient do labs on same day and wants to know if she should keep the appointment on 12/22/19 or if she should reschedule her appointment if there has not been enough time since stopping Biotin for labs to be done on 12/22/19.

## 2019-12-20 NOTE — Telephone Encounter (Signed)
Spoke to patient - did inform her that per Dr Kelton Pillar, she can keep her appointment.

## 2019-12-21 ENCOUNTER — Other Ambulatory Visit: Payer: Self-pay | Admitting: Internal Medicine

## 2019-12-21 DIAGNOSIS — E0500 Thyrotoxicosis with diffuse goiter without thyrotoxic crisis or storm: Secondary | ICD-10-CM

## 2019-12-21 NOTE — Progress Notes (Signed)
Name: Tammy [name]  MRN/ DOB: [id number], [birth year]    Age/ Sex: 64 y.o., female     PCP: Rusty Aus, MD   Reason for Endocrinology Evaluation: Hyperthyroidism     Initial Endocrinology Clinic Visit: 05/18/18    PATIENT IDENTIFIER: Tammy [name] is a 64 y.o., female with a past medical history of Myasthenia Gravis  . She has followed with [facility] since 05/18/18 for consultative assistance with management of her low TSH .   HISTORICAL SUMMARY:  Pt was found to have hyperthyroidism on routine labs in 03/2018, with a TSH of 0.008 uIU/mL and elevated T4 of 2.45 ng/dL.    In November, 2019 she was diagnosed with Myasthenia Gravis, she was initially on on Cellcept, Mestinon and prednisone. Prednisone was tapered off fol lowed by mestinon and currently  On cellcept.  By the time of her presentation to our clinic, her FT4 have normalized and TSH was improving so we opted not to start any thionamide therapy at the time .  Methimazole was started in 07/2018 with a TSH of < 0.006 uIU/mL and elevated FT4 at 2.99  SUBJECTIVE:   Today (12/22/2019):  Tammy [name] is here for a follow up on her graves's disease.    She has been on methimazole since 07/2018.  She is on methimazole 2.5 mg daily except Sunday   Denies constipation  Has noted double vision towards the right. Has slight burning Denies depression  Weight has been stable , but has noted slight weight gain Has noted hair loss.    ROS:  As per HPI.   HISTORY:  Past Medical History:  Past Medical History:  Diagnosis Date  . Environmental allergies   . Graves disease   . Headache(784.0)    last one 1 year ago  . IBS (irritable bowel syndrome)   . Migraines   . PONV (postoperative nausea and vomiting)    Past Surgical History:  Past Surgical History:  Procedure Laterality Date  . ABDOMINAL HYSTERECTOMY  1980's   total  . ANTERIOR CERVICAL DECOMP/DISCECTOMY FUSION  07/29/2011   Procedure:  ANTERIOR CERVICAL DECOMPRESSION/DISCECTOMY FUSION 3 LEVEL/HARDWARE REMOVAL;  Surgeon: Ophelia Charter, MD;  Location: [facility];  Service: Neurosurgery;  Laterality: Bilateral;  Cervical four-five, five-six, six-seven anterior cervical decompresion with fusion, interbody prothesis, plating and bonegraft  . HIP SURGERY  2006   repair post injury  . [facility] SINUS  2005    Social History:  reports that she has quit smoking. Her smoking use included cigarettes. She has a 10.00 pack-year smoking history. She has never used smokeless tobacco. She reports current alcohol use. She reports that she does not use drugs.  Family History: family history includes COPD in her mother.       OBJECTIVE:   PHYSICAL EXAM: VS: BP 98/62 (BP Location: Left Arm, Patient Position: Sitting, Cuff Size: Normal)   Pulse (!) 53   Ht 4\' 11"  (1.499 m)   Wt 107 lb 3.2 oz (48.6 kg)   SpO2 98%   BMI 21.65 kg/m    EXAM: General: Pt appears well and is in NAD  Neck: General: Supple without adenopathy. Thyroid: Thyroid size normal.   Lungs: Clear with good BS bilat with no rales, rhonchi, or wheezes  Heart: Auscultation: RRR.  Extremities:  BL LE: No pretibial edema normal ROM and strength.  Skin:  Patient noted with thinning of the cuticles and irregular nail tip.  Mental Status: Judgment, insight: Intact  Mood and affect: No depression, anxiety, or agitation     DATA REVIEWED:  Results for Tammy [name], Tammy [name] (MRN [id number]) as of 12/22/2019 15:00  Ref. Range 12/22/2019 07:26  Sodium Latest Ref Range: 135 - 145 mEq/L 138  Potassium Latest Ref Range: 3.5 - 5.1 mEq/L 4.2  Chloride Latest Ref Range: 96 - 112 mEq/L 103  CO2 Latest Ref Range: 19 - 32 mEq/L 26  Glucose Latest Ref Range: 70 - 99 mg/dL 83  BUN Latest Ref Range: 6 - 23 mg/dL 17  Creatinine Latest Ref Range: 0.40 - 1.20 mg/dL 0.83  Calcium Latest Ref Range: 8.4 - 10.5 mg/dL 9.4  Alkaline Phosphatase Latest Ref Range: 39 - 117 U/L 43  Albumin  Latest Ref Range: 3.5 - 5.2 g/dL 4.3  AST Latest Ref Range: 0 - 37 U/L 17  ALT Latest Ref Range: 0 - 35 U/L 13  Total Protein Latest Ref Range: 6.0 - 8.3 g/dL 6.5  Total Bilirubin Latest Ref Range: 0.2 - 1.2 mg/dL 0.5  GFR Latest Ref Range: >60.00 mL/min 69.72  TSH Latest Ref Range: 0.35 - 4.50 uIU/mL 4.19  T4,Free(Direct) Latest Ref Range: 0.60 - 1.60 ng/dL 0.80     Results for Tammy [name], Tammy [name] (MRN [id number]) as of 06/24/2018 07:55  Ref. Range 05/18/2018 15:26  Thyrotropin Receptor Ab Latest Ref Range: <=16.0 % 23.9 (H)    ASSESSMENT / PLAN / RECOMMENDATIONS:   1. Hyperthyroidism Secondary to Graves' Disease:    Pt with c/o  hair loss , fatigue and weight gain but her thyroid function is normal  Denies any local neck symptoms.   Will reduce methimazole as below    Medications Methimazole 5 mg ,2.5 mg Three times a weekly     2. Graves' Disease :  - Pt with graves' orbitopathy, she completed tepezza in 06/2019, started to have double vision and burning again     F/u in 4 months    Addendum: A letter will be sent to the patient with new recommendations  Signed electronically by: Mack Guise, MD  [facility]  [address] [place], [place] [address] Phone: [phone] FAX: [phone]      CC: Rusty Aus, [address] [place] [address] Phone: [phone]  Fax: [phone]   Return to Endocrinology clinic as below: Future Appointments  Date Time Provider [place]  02/05/2020  2:30 PM Ralene Bathe, MD [facility] None  03/14/2020  8:15 AM Suzzanne Cloud, NP [facility] None  04/26/2020  7:30 AM Eual Lindstrom, Melanie Crazier, MD [facility] None

## 2019-12-22 ENCOUNTER — Other Ambulatory Visit: Payer: Self-pay

## 2019-12-22 ENCOUNTER — Encounter: Payer: Self-pay | Admitting: Internal Medicine

## 2019-12-22 ENCOUNTER — Ambulatory Visit (INDEPENDENT_AMBULATORY_CARE_PROVIDER_SITE_OTHER): Payer: BC Managed Care – PPO | Admitting: Internal Medicine

## 2019-12-22 VITALS — BP 98/62 | HR 53 | Ht 59.0 in | Wt 107.2 lb

## 2019-12-22 DIAGNOSIS — E0500 Thyrotoxicosis with diffuse goiter without thyrotoxic crisis or storm: Secondary | ICD-10-CM

## 2019-12-22 DIAGNOSIS — E0590 Thyrotoxicosis, unspecified without thyrotoxic crisis or storm: Secondary | ICD-10-CM

## 2019-12-22 LAB — COMPREHENSIVE METABOLIC PANEL
ALT: 13 U/L (ref 0–35)
AST: 17 U/L (ref 0–37)
Albumin: 4.3 g/dL (ref 3.5–5.2)
Alkaline Phosphatase: 43 U/L (ref 39–117)
BUN: 17 mg/dL (ref 6–23)
CO2: 26 mEq/L (ref 19–32)
Calcium: 9.4 mg/dL (ref 8.4–10.5)
Chloride: 103 mEq/L (ref 96–112)
Creatinine, Ser: 0.83 mg/dL (ref 0.40–1.20)
GFR: 69.72 mL/min (ref >60.00)
Glucose, Bld: 83 mg/dL (ref 70–99)
Potassium: 4.2 mEq/L (ref 3.5–5.1)
Sodium: 138 mEq/L (ref 135–145)
Total Bilirubin: 0.5 mg/dL (ref 0.2–1.2)
Total Protein: 6.5 g/dL (ref 6.0–8.3)

## 2019-12-22 LAB — TSH: TSH: 4.19 u[IU]/mL (ref 0.35–4.50)

## 2019-12-22 LAB — T4, FREE: Free T4: 0.8 ng/dL (ref 0.60–1.60)

## 2019-12-22 NOTE — Patient Instructions (Signed)
-   Continue Methimazole 5 mg, HALF a tablet except Sundays until you hear otherwise from [place]

## 2019-12-29 ENCOUNTER — Other Ambulatory Visit: Payer: Self-pay | Admitting: Neurology

## 2020-01-03 ENCOUNTER — Other Ambulatory Visit: Payer: Self-pay | Admitting: Neurology

## 2020-01-09 DIAGNOSIS — M5090 Cervical disc disorder, unspecified, unspecified cervical region: Secondary | ICD-10-CM | POA: Diagnosis not present

## 2020-01-09 DIAGNOSIS — M62838 Other muscle spasm: Secondary | ICD-10-CM | POA: Diagnosis not present

## 2020-01-12 ENCOUNTER — Other Ambulatory Visit: Payer: Self-pay | Admitting: Neurology

## 2020-02-05 ENCOUNTER — Other Ambulatory Visit: Payer: Self-pay

## 2020-02-05 ENCOUNTER — Ambulatory Visit (INDEPENDENT_AMBULATORY_CARE_PROVIDER_SITE_OTHER): Payer: BC Managed Care – PPO | Admitting: Dermatology

## 2020-02-05 DIAGNOSIS — D229 Melanocytic nevi, unspecified: Secondary | ICD-10-CM | POA: Diagnosis not present

## 2020-02-05 DIAGNOSIS — L814 Other melanin hyperpigmentation: Secondary | ICD-10-CM

## 2020-02-05 DIAGNOSIS — L821 Other seborrheic keratosis: Secondary | ICD-10-CM

## 2020-02-05 DIAGNOSIS — L570 Actinic keratosis: Secondary | ICD-10-CM | POA: Diagnosis not present

## 2020-02-05 DIAGNOSIS — Z1283 Encounter for screening for malignant neoplasm of skin: Secondary | ICD-10-CM

## 2020-02-05 DIAGNOSIS — L820 Inflamed seborrheic keratosis: Secondary | ICD-10-CM | POA: Diagnosis not present

## 2020-02-05 DIAGNOSIS — D1800 Hemangioma unspecified site: Secondary | ICD-10-CM | POA: Diagnosis not present

## 2020-02-05 DIAGNOSIS — D692 Other nonthrombocytopenic purpura: Secondary | ICD-10-CM

## 2020-02-05 DIAGNOSIS — L578 Other skin changes due to chronic exposure to nonionizing radiation: Secondary | ICD-10-CM

## 2020-02-05 NOTE — Progress Notes (Signed)
New Patient Visit  Subjective  Tammy [name] is a 64 y.o. female who presents for the following: Annual Exam (New pt presents for a TBSE ). The patient presents for Total-Body Skin Exam (TBSE) for skin cancer screening and mole check.  The following portions of the chart were reviewed this encounter and updated as appropriate:  Tobacco  Allergies  Meds  Problems  Med Hx  Surg Hx  Fam Hx     Review of Systems:  No other skin or systemic complaints except as noted in HPI or Assessment and Plan.  Objective  Well appearing patient in no apparent distress; mood and affect are within normal limits.  A full examination was performed including scalp, head, eyes, ears, nose, lips, neck, chest, axillae, abdomen, back, buttocks, bilateral upper extremities, bilateral lower extremities, hands, feet, fingers, toes, fingernails, and toenails. All findings within normal limits unless otherwise noted below.  Objective  Right mid lateral forearm: Erythematous thin papules/macules with gritty scale.   Objective  Right Knee, Left back (2): Erythematous keratotic or waxy stuck-on papule or plaque.    Assessment & Plan  AK (actinic keratosis) Right mid lateral forearm  Destruction of lesion - Right mid lateral forearm Complexity: simple   Destruction method: cryotherapy   Informed consent: discussed and consent obtained   Timeout:  patient name, date of birth, surgical site, and procedure verified Lesion destroyed using liquid nitrogen: Yes   Region frozen until ice ball extended beyond lesion: Yes   Outcome: patient tolerated procedure well with no complications   Post-procedure details: wound care instructions given    Inflamed seborrheic keratosis (2) Right Knee, Left back  Destruction of lesion - Right Knee, Left back Complexity: simple   Destruction method: cryotherapy   Informed consent: discussed and consent obtained   Timeout:  patient name, date of birth, surgical  site, and procedure verified Lesion destroyed using liquid nitrogen: Yes   Region frozen until ice ball extended beyond lesion: Yes   Outcome: patient tolerated procedure well with no complications   Post-procedure details: wound care instructions given    Skin cancer screening   Lentigines - Scattered tan macules - Discussed due to sun exposure - Benign, observe - Call for any changes  Seborrheic Keratoses - Stuck-on, waxy, tan-brown papules and plaques  - Discussed benign etiology and prognosis. - Observe - Call for any changes  Melanocytic Nevi - Tan-brown and/or pink-flesh-colored symmetric macules and papules - Benign appearing on exam today - Observation - Call clinic for new or changing moles - Recommend daily use of broad spectrum spf 30+ sunscreen to sun-exposed areas.   Hemangiomas - Red papules - Discussed benign nature - Observe - Call for any changes  Actinic Damage - diffuse scaly erythematous macules with underlying dyspigmentation - Recommend daily broad spectrum sunscreen SPF 30+ to sun-exposed areas, reapply every 2 hours as needed.  - Call for new or changing lesions.  Skin cancer screening performed today.  Purpura- Arms  Recommend otc DerMend for brusing  - Violaceous macules and patches - Benign - Related to age, sun damage and/or use of blood thinners - Observe - Can use OTC arnica containing moisturizer such as Dermend Bruise Formula if desired - Call for worsening or other concerns  Return in about 3 months (around 05/07/2020) for Aks .  Marta Lamas, CMA, am acting as scribe for Sarina Ser, MD .  Documentation: I have reviewed the above documentation for accuracy and completeness, and I agree  with the above.  Sarina Ser, MD

## 2020-02-05 NOTE — Patient Instructions (Signed)

## 2020-02-06 DIAGNOSIS — M542 Cervicalgia: Secondary | ICD-10-CM | POA: Diagnosis not present

## 2020-02-06 DIAGNOSIS — M4712 Other spondylosis with myelopathy, cervical region: Secondary | ICD-10-CM | POA: Diagnosis not present

## 2020-02-06 DIAGNOSIS — S161XXA Strain of muscle, fascia and tendon at neck level, initial encounter: Secondary | ICD-10-CM | POA: Diagnosis not present

## 2020-02-08 ENCOUNTER — Encounter: Payer: Self-pay | Admitting: Dermatology

## 2020-02-15 DIAGNOSIS — Z20828 Contact with and (suspected) exposure to other viral communicable diseases: Secondary | ICD-10-CM | POA: Diagnosis not present

## 2020-03-13 NOTE — Progress Notes (Signed)
PATIENT: Tammy [name] DOB: [birth year]  REASON FOR VISIT: follow up HISTORY FROM: patient  HISTORY OF PRESENT ILLNESS: Today 03/14/20  HISTORY  Tammy [name] is a 64 years old female, seen in request by her ophthalmologist Dr. Manuella Ghazi, [place] for evaluation of double vision, initial evaluation was on May 03, 2018.  I have reviewed and summarized the referring note from the referring physician dated April 08, 2018, she was evaluated by her ophthalmologist Dr. 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  Without clear trigger event, patient noticed mild blurry vision when she is driving since September 2019, later she was able to discern it was double vision, double vision gets better closing either left or right eye, the position of the double vision changes, if she closed one eye, the double vision might improve, her double vision also varies, more obvious when she gets tired, and at the end of the day.  She had a history of sinus surgery in 4097, which is complicated by periorbital infection, misalignment of her vision, which lasted for 3 to 4 months, during that period of time, she did have binocular double vision,  She denies swallowing difficulty, denies limb muscle weakness, denies chewing difficulty,  Recently she also had weight loss.  TSH was significantly decreased 0.008 with elevated free T36.2, free T4 2.45 normal CBC hemoglobin of 14.4, CMP, LDL was 100, cholesterol was 187  Laboratory evaluation on May 09, 2018 showed positive acetylcholine binding antibody, with titer of 1.62, blocking antibody 40,  UPDATE May 31 2018: I personally reviewed MRI report May 18, 2018 that was normal.  Ct chest in November 2019, no evidence of thymoma, diffuse bronchial wall thickening with mild to moderate centrilobular and paraseptal emphysema, consistent with  underlying COPD, groundglass attenuation nodule, largest at the left upper lobe 9 x 12 mm,  She has been taking tapering dose of prednisone, now taking prednisone 5mg  3 tabs a day x 2 weeks, and also cellcept 500mg  2 tabs bid, tolerating it well, with mild side effect of acid reflux,  She has not taking Mestinon yet  UPDATE September 11 2019: She has lost follow-up since last visit in December 2019, continue take CellCept 500 mg 2 tablets twice a day, no longer taking Mestinon,  She is under endocrinologist Dr.Shamleffer care, reviewed most recent note on August 24, 2019, hyperthyroidism, Graves' disease, was treated with methimazole since February 2020, infusion of Tepezza in January 2021  Her myasthenia gravis has been under good control, no ptosis, she does complains of fatigue, since better control of her hyperthyroid disease, she no longer has double vision, she denies difficulty swallowing, or limb muscle weakness, she has not been taking Mestinon, at her worst, she described worsening fatigue, double vision, difficulty getting up from squatting down position  Update March 14, 2020 SS: When last seen, decrease dose of CellCept 500 mg twice a day, no longer taking Mestinon.  CBC, CMP were normal.  Did really well with dose reduction of CellCept, no worsening symptoms, specifically no double vision, or limb muscle weakness.  Graves' disease is under good control, on lower dose of methimazole, stable.  Needs gabapentin for itching. Has horses, is quite active, barrel racing-she does this well, requires significant strength.  Works full-time as Medical illustrator.  REVIEW OF SYSTEMS: Out of a complete 14 system review of symptoms, the patient complains only of the following  symptoms, and all other reviewed systems are negative.  N/A  ALLERGIES: Allergies  Allergen Reactions  . Sulfa Antibiotics Hives  . Molds & Smuts     HOME MEDICATIONS: Outpatient Medications Prior to Visit   Medication Sig Dispense Refill  . acetaminophen (TYLENOL) 500 MG tablet Take 1,000 mg by mouth every 6 (six) hours as needed for headache.    . anti-nausea (EMETROL) solution Take 10 mLs by mouth every 15 (fifteen) minutes as needed for nausea or vomiting.    Marland Kitchen BREO ELLIPTA 100-25 MCG/INH AEPB Inhale 1 puff into the lungs daily.  3  . citalopram (CELEXA) 20 MG tablet Take 20 mg by mouth daily.    Marland Kitchen estradiol (ESTRACE) 1 MG tablet Take 1 mg by mouth daily.    . fluticasone (VERAMYST) 27.5 MCG/SPRAY nasal spray Place 2 sprays into the nose daily as needed. For allergies    . HYDROcodone-acetaminophen (NORCO/VICODIN) 5-325 MG tablet Take 1 tablet by mouth every 6 (six) hours as needed for moderate pain.    . hydrOXYzine (ATARAX/VISTARIL) 25 MG tablet Take 25 mg by mouth as needed for itching.     . methimazole (TAPAZOLE) 5 MG tablet TAKE (1/2) TABLET BY MOUTH ONCE DAILY. 45 tablet 0  . montelukast (SINGULAIR) 10 MG tablet Take 10 mg by mouth at bedtime.    . ondansetron (ZOFRAN ODT) 4 MG disintegrating tablet Take 1 tablet (4 mg total) by mouth every 8 (eight) hours as needed. 20 tablet 0  . OXYBUTYNIN CHLORIDE PO     . Polyethyl Glycol-Propyl Glycol (SYSTANE) 0.4-0.3 % GEL ophthalmic gel Place 1 application into both eyes 3 (three) times daily.    . Probiotic Product (PRO-BIOTIC BLEND PO) Take by mouth.    . gabapentin (NEURONTIN) 300 MG capsule Take 1 capsule (300 mg total) by mouth 2 (two) times daily. 180 capsule 4  . mycophenolate (CELLCEPT) 500 MG tablet Take 1 tablet (500 mg total) by mouth 2 (two) times daily. 180 tablet 4  . estradiol (VIVELLE-DOT) 0.1 MG/24HR Place 1 patch onto the skin 2 (two) times a week.     No facility-administered medications prior to visit.    PAST MEDICAL HISTORY: Past Medical History:  Diagnosis Date  . Environmental allergies   . Graves disease   . Headache(784.0)    last one 1 year ago  . IBS (irritable bowel syndrome)   . Migraines   . PONV  (postoperative nausea and vomiting)     PAST SURGICAL HISTORY: Past Surgical History:  Procedure Laterality Date  . ABDOMINAL HYSTERECTOMY  1980's   total  . ANTERIOR CERVICAL DECOMP/DISCECTOMY FUSION  07/29/2011   Procedure: ANTERIOR CERVICAL DECOMPRESSION/DISCECTOMY FUSION 3 LEVEL/HARDWARE REMOVAL;  Surgeon: Ophelia Charter, MD;  Location: [facility];  Service: Neurosurgery;  Laterality: Bilateral;  Cervical four-five, five-six, six-seven anterior cervical decompresion with fusion, interbody prothesis, plating and bonegraft  . HIP SURGERY  2006   repair post injury  . [facility] SINUS  2005    FAMILY HISTORY: Family History  Problem Relation Age of Onset  . COPD Mother   . Anesthesia problems Neg Hx     SOCIAL HISTORY: Social History   Socioeconomic History  . Marital status: Married    Spouse name: Not on file  . Number of children: Not on file  . Years of education: 6  . Highest education level: Bachelor's degree (e.g., BA, AB, BS)  Occupational History  . Not on file  Tobacco Use  . Smoking status:  Former Smoker    Packs/day: 0.50    Years: 20.00    Pack years: 10.00    Types: Cigarettes  . Smokeless tobacco: Never Used  Vaping Use  . Vaping Use: Former  . Substances: Nicotine  Substance and Sexual Activity  . Alcohol use: Yes    Comment: 6 times a year   . Drug use: Never  . Sexual activity: Not on file  Other Topics Concern  . Not on file  Social History Narrative   Lives at home with husband   Left handed   Caffeine: daily, 2 cups    Social Determinants of Health   Financial Resource Strain:   . Difficulty of Paying Living Expenses: Not on file  Food Insecurity:   . Worried About Charity fundraiser in the Last Year: Not on file  . Ran Out of Food in the Last Year: Not on file  Transportation Needs:   . Lack of Transportation (Medical): Not on file  . Lack of Transportation (Non-Medical): Not on file  Physical Activity:   . Days of Exercise per  Week: Not on file  . Minutes of Exercise per Session: Not on file  Stress:   . Feeling of Stress : Not on file  Social Connections:   . Frequency of Communication with Friends and Family: Not on file  . Frequency of Social Gatherings with Friends and Family: Not on file  . Attends Religious Services: Not on file  . Active Member of Clubs or Organizations: Not on file  . Attends Archivist Meetings: Not on file  . Marital Status: Not on file  Intimate Partner Violence:   . Fear of Current or Ex-Partner: Not on file  . Emotionally Abused: Not on file  . Physically Abused: Not on file  . Sexually Abused: Not on file   PHYSICAL EXAM  Vitals:   03/14/20 0800  BP: 104/65  Pulse: (!) 53  Weight: 113 lb 6.4 oz (51.4 kg)  Height: 4\' 11"  (1.499 m)   Body mass index is 22.9 kg/m.  Generalized: Well developed, in no acute distress   Neurological examination  Mentation: Alert oriented to time, place, history taking. Follows all commands speech and language fluent Cranial nerve II-XII: Pupils were equal round reactive to light. Extraocular movements were full, visual field were full on confrontational test. Facial sensation and strength were normal, no eye closure or cheek puff weakness noted.  Head turning and shoulder shrug were normal and symmetric. Motor: The motor testing reveals 5 over 5 strength of all 4 extremities. Good symmetric motor tone is noted throughout.  Sensory: Sensory testing is intact to soft touch on all 4 extremities. No evidence of extinction is noted.  Coordination: Cerebellar testing reveals good finger-nose-finger and heel-to-shin bilaterally.  Gait and station: Gait is normal.  Reflexes: Deep tendon reflexes are symmetric and normal bilaterally.   DIAGNOSTIC DATA (LABS, IMAGING, TESTING) - I reviewed patient records, labs, notes, testing and imaging myself where available.  Lab Results  Component Value Date   WBC 12.3 (H) 09/13/2019   HGB 12.8  09/13/2019   HCT 39.3 09/13/2019   MCV 89.3 09/13/2019   PLT 229 09/13/2019      Component Value Date/Time   NA 138 12/22/2019 0726   NA 139 09/11/2019 0839   K 4.2 12/22/2019 0726   CL 103 12/22/2019 0726   CO2 26 12/22/2019 0726   GLUCOSE 83 12/22/2019 0726   BUN 17 12/22/2019 0726  BUN 14 09/11/2019 0839   CREATININE 0.83 12/22/2019 0726   CALCIUM 9.4 12/22/2019 0726   PROT 6.5 12/22/2019 0726   PROT 6.2 09/11/2019 0839   ALBUMIN 4.3 12/22/2019 0726   ALBUMIN 4.4 09/11/2019 0839   AST 17 12/22/2019 0726   ALT 13 12/22/2019 0726   ALKPHOS 43 12/22/2019 0726   BILITOT 0.5 12/22/2019 0726   BILITOT 0.3 09/11/2019 0839   GFRNONAA >60 09/13/2019 0346   GFRAA >60 09/13/2019 0346   No results found for: CHOL, HDL, LDLCALC, LDLDIRECT, TRIG, CHOLHDL No results found for: HGBA1C No results found for: VITAMINB12 Lab Results  Component Value Date   TSH 4.19 12/22/2019      ASSESSMENT AND PLAN 64 y.o. year old female  has a past medical history of Environmental allergies, Graves disease, Headache(784.0), IBS (irritable bowel syndrome), Migraines, and PONV (postoperative nausea and vomiting). here with:  1.  Seropositive ocular myasthenia gravis -Confirmed by positive acetylcholine binding antibody -MRI of the brain was normal -CT of chest showed no evidence of thymus pathology -Since March tolerating lower dose of CellCept 500 mg twice a day, without recurrent symptoms of MG -Will try lower dose CellCept 250 mg twice daily, recurrence of any symptoms, will resume higher dose -No longer taking Mestinon -Check CBC, CMP -Follow-up in 6 months or sooner if needed  2.  Graves' disease, hyperthyroidism -under care of endocrinologist -Doing well, stable  I spent 30 minutes of face-to-face and non-face-to-face time with patient.  This included previsit chart review, lab review, study review, order entry, electronic health record documentation, patient education.  Butler Denmark,  AGNP-C, DNP 03/14/2020, 8:38 AM [facility] [address] [place], [place] [address] [phone]

## 2020-03-14 ENCOUNTER — Ambulatory Visit (INDEPENDENT_AMBULATORY_CARE_PROVIDER_SITE_OTHER): Payer: BC Managed Care – PPO | Admitting: Neurology

## 2020-03-14 ENCOUNTER — Encounter: Payer: Self-pay | Admitting: Neurology

## 2020-03-14 VITALS — BP 104/65 | HR 53 | Ht 59.0 in | Wt 113.4 lb

## 2020-03-14 DIAGNOSIS — G7000 Myasthenia gravis without (acute) exacerbation: Secondary | ICD-10-CM

## 2020-03-14 MED ORDER — MYCOPHENOLATE MOFETIL 500 MG PO TABS: 250.0000 mg | ORAL_TABLET | Freq: Two times a day (BID) | ORAL | 4 refills | Status: DC

## 2020-03-14 MED ORDER — GABAPENTIN 300 MG PO CAPS: 300.0000 mg | ORAL_CAPSULE | Freq: Two times a day (BID) | ORAL | 4 refills | Status: DC

## 2020-03-14 NOTE — Patient Instructions (Signed)
Decrease Cellcept to 250 mg twice daily If any symptoms return, go back up 500 mg twice daily Check routine blood work today  Continue gabapentin, refill sent  See you back in 6 months

## 2020-03-15 LAB — COMPREHENSIVE METABOLIC PANEL
ALT: 12 IU/L (ref 0–32)
AST: 14 IU/L (ref 0–40)
Albumin/Globulin Ratio: 2.6 — ABNORMAL HIGH (ref 1.2–2.2)
Albumin: 4.7 g/dL (ref 3.8–4.8)
Alkaline Phosphatase: 66 IU/L (ref 44–121)
BUN/Creatinine Ratio: 22 (ref 12–28)
BUN: 17 mg/dL (ref 8–27)
Bilirubin Total: 0.4 mg/dL (ref 0.0–1.2)
CO2: 26 mmol/L (ref 20–29)
Calcium: 9.5 mg/dL (ref 8.7–10.3)
Chloride: 103 mmol/L (ref 96–106)
Creatinine, Ser: 0.77 mg/dL (ref 0.57–1.00)
GFR calc Af Amer: 96 mL/min/{1.73_m2} (ref >59)
GFR calc non Af Amer: 84 mL/min/{1.73_m2} (ref >59)
Globulin, Total: 1.8 g/dL (ref 1.5–4.5)
Glucose: 94 mg/dL (ref 65–99)
Potassium: 4.5 mmol/L (ref 3.5–5.2)
Sodium: 141 mmol/L (ref 134–144)
Total Protein: 6.5 g/dL (ref 6.0–8.5)

## 2020-03-15 LAB — CBC WITH DIFFERENTIAL/PLATELET
Basophils Absolute: 0 10*3/uL (ref 0.0–0.2)
Basos: 1 %
EOS (ABSOLUTE): 0.2 10*3/uL (ref 0.0–0.4)
Eos: 4 %
Hematocrit: 43.3 % (ref 34.0–46.6)
Hemoglobin: 14.1 g/dL (ref 11.1–15.9)
Immature Grans (Abs): 0 10*3/uL (ref 0.0–0.1)
Immature Granulocytes: 0 %
Lymphocytes Absolute: 1.9 10*3/uL (ref 0.7–3.1)
Lymphs: 34 %
MCH: 30.3 pg (ref 26.6–33.0)
MCHC: 32.6 g/dL (ref 31.5–35.7)
MCV: 93 fL (ref 79–97)
Monocytes Absolute: 0.5 10*3/uL (ref 0.1–0.9)
Monocytes: 8 %
Neutrophils Absolute: 3.1 10*3/uL (ref 1.4–7.0)
Neutrophils: 53 %
Platelets: 274 10*3/uL (ref 150–450)
RBC: 4.66 x10E6/uL (ref 3.77–5.28)
RDW: 12.3 % (ref 11.7–15.4)
WBC: 5.7 10*3/uL (ref 3.4–10.8)

## 2020-03-19 DIAGNOSIS — Z131 Encounter for screening for diabetes mellitus: Secondary | ICD-10-CM | POA: Diagnosis not present

## 2020-03-19 DIAGNOSIS — E559 Vitamin D deficiency, unspecified: Secondary | ICD-10-CM | POA: Diagnosis not present

## 2020-03-19 DIAGNOSIS — E782 Mixed hyperlipidemia: Secondary | ICD-10-CM | POA: Diagnosis not present

## 2020-03-19 DIAGNOSIS — E538 Deficiency of other specified B group vitamins: Secondary | ICD-10-CM | POA: Diagnosis not present

## 2020-03-26 DIAGNOSIS — Z23 Encounter for immunization: Secondary | ICD-10-CM | POA: Diagnosis not present

## 2020-03-26 DIAGNOSIS — A09 Infectious gastroenteritis and colitis, unspecified: Secondary | ICD-10-CM | POA: Diagnosis not present

## 2020-03-26 DIAGNOSIS — Z0000 Encounter for general adult medical examination without abnormal findings: Secondary | ICD-10-CM | POA: Diagnosis not present

## 2020-03-26 DIAGNOSIS — E782 Mixed hyperlipidemia: Secondary | ICD-10-CM | POA: Diagnosis not present

## 2020-03-28 DIAGNOSIS — A09 Infectious gastroenteritis and colitis, unspecified: Secondary | ICD-10-CM | POA: Diagnosis not present

## 2020-04-26 ENCOUNTER — Ambulatory Visit (INDEPENDENT_AMBULATORY_CARE_PROVIDER_SITE_OTHER): Payer: BC Managed Care – PPO | Admitting: Internal Medicine

## 2020-04-26 ENCOUNTER — Other Ambulatory Visit: Payer: Self-pay

## 2020-04-26 ENCOUNTER — Encounter: Payer: Self-pay | Admitting: Internal Medicine

## 2020-04-26 DIAGNOSIS — E0500 Thyrotoxicosis with diffuse goiter without thyrotoxic crisis or storm: Secondary | ICD-10-CM | POA: Diagnosis not present

## 2020-04-26 LAB — TSH: TSH: 0.26 u[IU]/mL — ABNORMAL LOW (ref 0.35–4.50)

## 2020-04-26 LAB — T4, FREE: Free T4: 1.01 ng/dL (ref 0.60–1.60)

## 2020-04-26 MED ORDER — METHIMAZOLE 5 MG PO TABS: 2.5000 mg | ORAL_TABLET | Freq: Every day | ORAL | 1 refills | Status: DC

## 2020-04-26 NOTE — Patient Instructions (Signed)
-   Continue Methimazole 5 mg, Half a tablet , three days a week for now

## 2020-04-26 NOTE — Progress Notes (Signed)
Name: Tammy [name]  MRN/ DOB: [id number], [birth year]    Age/ Sex: 64 y.o., female     PCP: Rusty Aus, MD   Reason for Endocrinology Evaluation: Hyperthyroidism     Initial Endocrinology Clinic Visit: 05/18/18    PATIENT IDENTIFIER: Tammy [name] is a 64 y.o., female with a past medical history of Myasthenia Gravis  . She has followed with [facility] since 05/18/18 for consultative assistance with management of her low TSH .   HISTORICAL SUMMARY:  Pt was found to have hyperthyroidism on routine labs in 03/2018, with a TSH of 0.008 uIU/mL and elevated T4 of 2.45 ng/dL.    In November, 2019 she was diagnosed with Myasthenia Gravis, she was initially on on Cellcept, Mestinon and prednisone. Prednisone was tapered off fol lowed by mestinon and currently  On cellcept.  By the time of her presentation to our clinic, her FT4 have normalized and TSH was improving so we opted not to start any thionamide therapy at the time .  Methimazole was started in 07/2018 with a TSH of < 0.006 uIU/mL and elevated FT4 at 2.99  SUBJECTIVE:   Today (04/26/2020):  Tammy [name] is here for a follow up on her graves's disease.    She has been on methimazole since 07/2018.  She is on methimazole 2.5 mg three times a week ( Mondays, Wednesdays and Fridays)    She has noted increase appetite and weight gain Denies constipation or diarrhea Double vision is worsening in the past 2-3 months, associated with burning and itching of the eyes - uses eye drops (Systane)    HISTORY:  Past Medical History:  Past Medical History:  Diagnosis Date  . Environmental allergies   . Graves disease   . Headache(784.0)    last one 1 year ago  . IBS (irritable bowel syndrome)   . Migraines   . PONV (postoperative nausea and vomiting)    Past Surgical History:  Past Surgical History:  Procedure Laterality Date  . ABDOMINAL HYSTERECTOMY  1980's   total  . ANTERIOR CERVICAL  DECOMP/DISCECTOMY FUSION  07/29/2011   Procedure: ANTERIOR CERVICAL DECOMPRESSION/DISCECTOMY FUSION 3 LEVEL/HARDWARE REMOVAL;  Surgeon: Ophelia Charter, MD;  Location: [facility];  Service: Neurosurgery;  Laterality: Bilateral;  Cervical four-five, five-six, six-seven anterior cervical decompresion with fusion, interbody prothesis, plating and bonegraft  . HIP SURGERY  2006   repair post injury  . [facility] SINUS  2005    Social History:  reports that she has quit smoking. Her smoking use included cigarettes. She has a 10.00 pack-year smoking history. She has never used smokeless tobacco. She reports current alcohol use. She reports that she does not use drugs.  Family History: family history includes COPD in her mother.       OBJECTIVE:   PHYSICAL EXAM: VS: Ht 4\' 11"  (1.499 m)   Wt 117 lb 8 oz (53.3 kg)   BMI 23.73 kg/m    EXAM: General: Pt appears well and is in NAD Examination of the eye did not show any proptosis but noted chemosis of the right eye   Neck: General: Supple without adenopathy. Thyroid: Thyroid size normal.   Lungs: Clear with good BS bilat with no rales, rhonchi, or wheezes  Heart: Auscultation: RRR.  Extremities:  BL LE: No pretibial edema normal ROM and strength.  Mental Status: Judgment, insight: Intact Mood and affect: No depression, anxiety, or agitation     DATA REVIEWED:  Results for  SHANDELLE, BORRELLI (MRN [id number]) as of 04/26/2020 14:59  Ref. Range 04/26/2020 07:59  TSH Latest Ref Range: 0.35 - 4.50 uIU/mL 0.26 (L)  T4,Free(Direct) Latest Ref Range: 0.60 - 1.60 ng/dL 1.01    Results for KEYAUNA, GRAEFE (MRN [id number]) as of 06/24/2018 07:55  Ref. Range 05/18/2018 15:26  Thyrotropin Receptor Ab Latest Ref Range: <=16.0 % 23.9 (H)    ASSESSMENT / PLAN / RECOMMENDATIONS:   1. Hyperthyroidism Secondary to Graves' Disease:    [name]   Denies any local neck symptoms.   TFT's today show low TSh, will increase  methimazole as below    Medications Methimazole 5 mg ,2.5 mg daily     2. Graves' Disease :  - Pt with graves' orbitopathy, she completed tepezza in 06/2019, continues with  double vision and burning. Continues to follow up with [facility]     F/u in 4 months    Addendum: A letter will be sent to the patient with new recommendations  Signed electronically by: Mack Guise, MD  [facility]  [facility] [address] [place], [place] [address] Phone: [phone] FAX: [phone]      CC: Rusty Aus, [place] [address] Phone: [phone]  Fax: [phone]   Return to Endocrinology clinic as below: Future Appointments  Date Time Provider [place]  05/06/2020  8:30 AM Ralene Bathe, MD [facility] None  09/12/2020  8:15 AM Suzzanne Cloud, NP [facility] None

## 2020-05-06 ENCOUNTER — Ambulatory Visit (INDEPENDENT_AMBULATORY_CARE_PROVIDER_SITE_OTHER): Payer: BC Managed Care – PPO | Admitting: Dermatology

## 2020-05-06 ENCOUNTER — Encounter: Payer: Self-pay | Admitting: Dermatology

## 2020-05-06 ENCOUNTER — Other Ambulatory Visit: Payer: Self-pay

## 2020-05-06 DIAGNOSIS — L821 Other seborrheic keratosis: Secondary | ICD-10-CM

## 2020-05-06 DIAGNOSIS — D692 Other nonthrombocytopenic purpura: Secondary | ICD-10-CM

## 2020-05-06 DIAGNOSIS — L988 Other specified disorders of the skin and subcutaneous tissue: Secondary | ICD-10-CM | POA: Diagnosis not present

## 2020-05-06 DIAGNOSIS — L578 Other skin changes due to chronic exposure to nonionizing radiation: Secondary | ICD-10-CM | POA: Diagnosis not present

## 2020-05-06 DIAGNOSIS — Z872 Personal history of diseases of the skin and subcutaneous tissue: Secondary | ICD-10-CM | POA: Diagnosis not present

## 2020-05-06 DIAGNOSIS — L820 Inflamed seborrheic keratosis: Secondary | ICD-10-CM | POA: Diagnosis not present

## 2020-05-06 NOTE — Progress Notes (Signed)
   Follow-Up Visit   Subjective  Tammy [name] is a 64 y.o. female who presents for the following: Actinic Keratosis (R mid lat forearm ) and Lesions (on the chest - irregular and new).  The following portions of the chart were reviewed this encounter and updated as appropriate:  Tobacco  Allergies  Meds  Problems  Med Hx  Surg Hx  Fam Hx     Review of Systems:  No other skin or systemic complaints except as noted in HPI or Assessment and Plan.  Objective  Well appearing patient in no apparent distress; mood and affect are within normal limits.  A focused examination was performed including the chest and extremities. Relevant physical exam findings are noted in the Assessment and Plan.  Objective  R mid lat forearm: Clear   Objective  Chest x 16, L arm x 1 (17): Erythematous keratotic or waxy stuck-on papule or plaque.   Objective  Face: Rhytides and volume loss.   Assessment & Plan  History of actinic keratosis R mid lat forearm Clear. Observe for recurrence. Call clinic for new or changing lesions.  Recommend regular skin exams, daily broad-spectrum spf 30+ sunscreen use, and photoprotection.     Inflamed seborrheic keratosis (17) Chest x 16, L arm x 1  Destruction of lesion - Chest x 16, L arm x 1 Complexity: simple   Destruction method: cryotherapy   Informed consent: discussed and consent obtained   Timeout:  patient name, date of birth, surgical site, and procedure verified Lesion destroyed using liquid nitrogen: Yes   Region frozen until ice ball extended beyond lesion: Yes   Outcome: patient tolerated procedure well with no complications   Post-procedure details: wound care instructions given    Elastosis of skin Face Deep perioral and facial wrinkling. Discussed Halo laser treatment; Elastin products.   Actinic Damage - chronic, secondary to cumulative UV radiation exposure/sun exposure over time - diffuse scaly erythematous macules with  underlying dyspigmentation - Recommend daily broad spectrum sunscreen SPF 30+ to sun-exposed areas, reapply every 2 hours as needed.  - Call for new or changing lesions.  Purpura - Violaceous macules and patches - Benign - Related to age, sun damage and/or use of blood thinners - Observe - Can use OTC arnica containing moisturizer such as Dermend Bruise Formula if desired - Call for worsening or other concerns  Seborrheic Keratoses - Stuck-on, waxy, tan-brown papules and plaques  - Discussed benign etiology and prognosis. - Observe - Call for any changes  Return in about 1 year (around 05/06/2021) for TBSE.  Luther Redo, CMA, am acting as scribe for Sarina Ser, MD .  Documentation: I have reviewed the above documentation for accuracy and completeness, and I agree with the above.  Sarina Ser, MD

## 2020-05-09 DIAGNOSIS — R21 Rash and other nonspecific skin eruption: Secondary | ICD-10-CM | POA: Diagnosis not present

## 2020-05-09 DIAGNOSIS — R059 Cough, unspecified: Secondary | ICD-10-CM | POA: Diagnosis not present

## 2020-05-09 DIAGNOSIS — J3089 Other allergic rhinitis: Secondary | ICD-10-CM | POA: Diagnosis not present

## 2020-05-09 DIAGNOSIS — H1045 Other chronic allergic conjunctivitis: Secondary | ICD-10-CM | POA: Diagnosis not present

## 2020-05-20 NOTE — Progress Notes (Signed)
I have reviewed and agreed above plan. 

## 2020-05-21 DIAGNOSIS — M542 Cervicalgia: Secondary | ICD-10-CM | POA: Diagnosis not present

## 2020-05-21 DIAGNOSIS — M4312 Spondylolisthesis, cervical region: Secondary | ICD-10-CM | POA: Diagnosis not present

## 2020-05-28 DIAGNOSIS — M4312 Spondylolisthesis, cervical region: Secondary | ICD-10-CM | POA: Diagnosis not present

## 2020-05-28 DIAGNOSIS — M4802 Spinal stenosis, cervical region: Secondary | ICD-10-CM | POA: Diagnosis not present

## 2020-06-14 DIAGNOSIS — M4722 Other spondylosis with radiculopathy, cervical region: Secondary | ICD-10-CM | POA: Diagnosis not present

## 2020-06-24 DIAGNOSIS — M4802 Spinal stenosis, cervical region: Secondary | ICD-10-CM | POA: Diagnosis not present

## 2020-06-24 DIAGNOSIS — M4712 Other spondylosis with myelopathy, cervical region: Secondary | ICD-10-CM | POA: Diagnosis not present

## 2020-06-24 DIAGNOSIS — M4722 Other spondylosis with radiculopathy, cervical region: Secondary | ICD-10-CM | POA: Diagnosis not present

## 2020-06-25 ENCOUNTER — Other Ambulatory Visit: Payer: Self-pay | Admitting: Internal Medicine

## 2020-07-17 DIAGNOSIS — M4722 Other spondylosis with radiculopathy, cervical region: Secondary | ICD-10-CM | POA: Diagnosis not present

## 2020-08-28 ENCOUNTER — Encounter: Payer: Self-pay | Admitting: Internal Medicine

## 2020-08-28 ENCOUNTER — Other Ambulatory Visit: Payer: Self-pay

## 2020-08-28 ENCOUNTER — Ambulatory Visit (INDEPENDENT_AMBULATORY_CARE_PROVIDER_SITE_OTHER): Payer: BC Managed Care – PPO | Admitting: Internal Medicine

## 2020-08-28 VITALS — BP 98/60 | HR 70 | Ht 59.0 in | Wt 118.5 lb

## 2020-08-28 DIAGNOSIS — E0500 Thyrotoxicosis with diffuse goiter without thyrotoxic crisis or storm: Secondary | ICD-10-CM

## 2020-08-28 DIAGNOSIS — E0590 Thyrotoxicosis, unspecified without thyrotoxic crisis or storm: Secondary | ICD-10-CM | POA: Diagnosis not present

## 2020-08-28 NOTE — Progress Notes (Signed)
Name: Tammy [name]  MRN/ DOB: [id number], 06/24/1956    Age/ Sex: 65 y.o., female     PCP: Rusty Aus, MD   Reason for Endocrinology Evaluation: Hyperthyroidism     Initial Endocrinology Clinic Visit: 05/18/18    PATIENT IDENTIFIER: Tammy [name] is a 65 y.o., female with a past medical history of Myasthenia Gravis  . She has followed with [facility] since 05/18/18 for consultative assistance with management of her low TSH .   HISTORICAL SUMMARY:  Pt was found to have hyperthyroidism on routine labs in 03/2018, with a TSH of 0.008 uIU/mL and elevated T4 of 2.45 ng/dL.    In November, 2019 she was diagnosed with Myasthenia Gravis, she was initially on on Cellcept, Mestinon and prednisone. Prednisone was tapered off fol lowed by mestinon and currently  On cellcept.  By the time of her presentation to our clinic, her FT4 have normalized and TSH was improving so we opted not to start any thionamide therapy at the time .  Methimazole was started in 07/2018 with a TSH of < 0.006 uIU/mL and elevated FT4 at 2.99  SUBJECTIVE:   Today (08/28/2020):  Tammy [name] is here for a follow up on her graves's disease.    She has been on methimazole since 07/2018.  She is on methimazole 2.5 mg daily   Has noted constipation last couple of days  Denies palpitations  Eyes have been hurting with light sensitivity but no double vision  Denies local neck symptoms       HISTORY:  Past Medical History:  Past Medical History:  Diagnosis Date  . Environmental allergies   . Graves disease   . Headache(784.0)    last one 1 year ago  . IBS (irritable bowel syndrome)   . Migraines   . PONV (postoperative nausea and vomiting)    Past Surgical History:  Past Surgical History:  Procedure Laterality Date  . ABDOMINAL HYSTERECTOMY  1980's   total  . ANTERIOR CERVICAL DECOMP/DISCECTOMY FUSION  07/29/2011   Procedure: ANTERIOR CERVICAL DECOMPRESSION/DISCECTOMY FUSION 3  LEVEL/HARDWARE REMOVAL;  Surgeon: Ophelia Charter, MD;  Location: [facility];  Service: Neurosurgery;  Laterality: Bilateral;  Cervical four-five, five-six, six-seven anterior cervical decompresion with fusion, interbody prothesis, plating and bonegraft  . HIP SURGERY  2006   repair post injury  . [facility] SINUS  2005    Social History:  reports that she has quit smoking. Her smoking use included cigarettes. She has a 10.00 pack-year smoking history. She has never used smokeless tobacco. She reports current alcohol use. She reports that she does not use drugs.  Family History: family history includes COPD in her mother.       OBJECTIVE:   PHYSICAL EXAM: VS: BP 98/60   Pulse 70   Ht 4\' 11"  (1.499 m)   Wt 118 lb 8 oz (53.8 kg)   SpO2 97%   BMI 23.93 kg/m    EXAM: General: Pt appears well and is in NAD Examination of the eye did not show any proptosis but noted chemosis of the right eye   Neck: General: Supple without adenopathy. Thyroid: Thyroid size normal.   Lungs: Clear with good BS bilat with no rales, rhonchi, or wheezes  Heart: Auscultation: RRR.  Extremities:  BL LE: No pretibial edema normal ROM and strength.  Mental Status: Judgment, insight: Intact Mood and affect: No depression, anxiety, or agitation     DATA REVIEWED: Results for Stemler, Deneene  A (MRN [id number]) as of 08/29/2020 10:54  Ref. Range 04/26/2020 07:59 08/28/2020 08:21  TSH Latest Ref Range: 0.35 - 4.50 uIU/mL 0.26 (L) <0.01 (L)  T4,Free(Direct) Latest Ref Range: 0.60 - 1.60 ng/dL 1.01 1.15    Results for ROBYN, GALATI (MRN [id number]) as of 06/24/2018 07:55  Ref. Range 05/18/2018 15:26  Thyrotropin Receptor Ab Latest Ref Range: <=16.0 % 23.9 (H)    ASSESSMENT / PLAN / RECOMMENDATIONS:   1. Hyperthyroidism Secondary to Graves' Disease:    [name]   Denies any local neck symptoms.   TFT's today continue to show worsening hyperthyroidism despite increasing Methimazole  dose, will increase again as below     Medications Increase Methimazole 5 mg ,to 1 tablet  daily     2. Graves' Disease :  - Pt with graves' orbitopathy, she completed tepezza in 06/2019, double vision has improved but having eye discomfort. . She was advised to  follow up with [facility]     F/u in 6 months Labs in 8 weeks on 10/29/2020 at 9 AM   Addendum: Discussed labs with the pt on 08/29/2020 at 1045    Signed electronically by: Mack Guise, MD  [facility]  [facility] [address] [place], [place] [address] Phone: [phone] FAX: [phone]      CC: Rusty Aus, [place] [address] Phone: [phone]  Fax: [phone]   Return to Endocrinology clinic as below: Future Appointments  Date Time Provider [place]  09/12/2020  8:15 AM Suzzanne Cloud, NP [facility] None  05/08/2021  8:30 AM Ralene Bathe, MD [facility] None

## 2020-08-29 LAB — TSH: TSH: <0.01 u[IU]/mL — ABNORMAL LOW (ref 0.35–4.50)

## 2020-08-29 LAB — T4, FREE: Free T4: 1.15 ng/dL (ref 0.60–1.60)

## 2020-08-29 MED ORDER — METHIMAZOLE 5 MG PO TABS: 5.0000 mg | ORAL_TABLET | Freq: Every day | ORAL | 1 refills | Status: AC

## 2020-08-30 LAB — TRAB (TSH RECEPTOR BINDING ANTIBODY): TRAB: 3.06 IU/L — ABNORMAL HIGH (ref <=2.00)

## 2020-09-04 ENCOUNTER — Telehealth: Payer: Self-pay | Admitting: Internal Medicine

## 2020-09-04 DIAGNOSIS — E0590 Thyrotoxicosis, unspecified without thyrotoxic crisis or storm: Secondary | ICD-10-CM

## 2020-09-04 NOTE — Telephone Encounter (Signed)
Pt called asking if she could increase her insulin, pt states she thinks the medication is not lasting as long. At night time pt states her symptoms worsen because the medication has worn off.  Pt requests the nurse give her a call regarding this.  Callback # [phone]

## 2020-09-04 NOTE — Telephone Encounter (Signed)
Spoken to patient and confirm that she is referring to methimazole 5 mg. Patient asked if she can take 1/2 tablet to help with her symptoms at night. Patient stated it feels like the medication wear off in the evening then she get itchy and heart racing. Is that okay?

## 2020-09-05 NOTE — Telephone Encounter (Signed)
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

## 2020-09-12 ENCOUNTER — Other Ambulatory Visit: Payer: Self-pay

## 2020-09-12 ENCOUNTER — Ambulatory Visit (INDEPENDENT_AMBULATORY_CARE_PROVIDER_SITE_OTHER): Payer: BC Managed Care – PPO | Admitting: Neurology

## 2020-09-12 ENCOUNTER — Encounter: Payer: Self-pay | Admitting: Neurology

## 2020-09-12 VITALS — BP 108/74 | HR 60 | Ht 59.0 in | Wt 120.0 lb

## 2020-09-12 DIAGNOSIS — G7000 Myasthenia gravis without (acute) exacerbation: Secondary | ICD-10-CM

## 2020-09-12 DIAGNOSIS — E0500 Thyrotoxicosis with diffuse goiter without thyrotoxic crisis or storm: Secondary | ICD-10-CM

## 2020-09-12 MED ORDER — MYCOPHENOLATE MOFETIL 500 MG PO TABS: 250.0000 mg | ORAL_TABLET | Freq: Two times a day (BID) | ORAL | 4 refills | Status: DC

## 2020-09-12 NOTE — Progress Notes (Signed)
PATIENT: Tammy [name] DOB: March 10, 1956  REASON FOR VISIT: follow up HISTORY FROM: patient  HISTORY OF PRESENT ILLNESS: Today 09/12/20  HISTORY  CHARLE [name] is a 65 years old female, seen in request by her ophthalmologist Dr. Manuella Ghazi, [place] for evaluation of double vision, initial evaluation was on May 03, 2018.  I have reviewed and summarized the referring note from the referring physician dated April 08, 2018, she was evaluated by her ophthalmologist Dr. 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  Without clear trigger event, patient noticed mild blurry vision when she is driving since September 2019, later she was able to discern it was double vision, double vision gets better closing either left or right eye, the position of the double vision changes, if she closed one eye, the double vision might improve, her double vision also varies, more obvious when she gets tired, and at the end of the day.  She had a history of sinus surgery in 2376, which is complicated by periorbital infection, misalignment of her vision, which lasted for 3 to 4 months, during that period of time, she did have binocular double vision,  She denies swallowing difficulty, denies limb muscle weakness, denies chewing difficulty,  Recently she also had weight loss.  TSH was significantly decreased 0.008 with elevated free T36.2, free T4 2.45 normal CBC hemoglobin of 14.4, CMP, LDL was 100, cholesterol was 187  Laboratory evaluation on May 09, 2018 showed positive acetylcholine binding antibody, with titer of 1.62, blocking antibody 40,  UPDATE May 31 2018: I personally reviewed MRI report May 18, 2018 that was normal.  Ct chest in November 2019, no evidence of thymoma, diffuse bronchial wall thickening with mild to moderate centrilobular and paraseptal emphysema, consistent with  underlying COPD, groundglass attenuation nodule, largest at the left upper lobe 9 x 12 mm,  She has been taking tapering dose of prednisone, now taking prednisone 5mg  3 tabs a day x 2 weeks, and also cellcept 500mg  2 tabs bid, tolerating it well, with mild side effect of acid reflux,  She has not taking Mestinon yet  UPDATE September 11 2019: She has lost follow-up since last visit in December 2019, continue take CellCept 500 mg 2 tablets twice a day, no longer taking Mestinon,  She is under endocrinologist Dr.Shamleffer care, reviewed most recent note on August 24, 2019, hyperthyroidism, Graves' disease, was treated with methimazole since February 2020, infusion of Tepezza in January 2021  Her myasthenia gravis has been under good control, no ptosis, she does complains of fatigue, since better control of her hyperthyroid disease, she no longer has double vision, she denies difficulty swallowing, or limb muscle weakness, she has not been taking Mestinon, at her worst, she described worsening fatigue, double vision, difficulty getting up from squatting down position  Update March 14, 2020 SS: When last seen, decrease dose of CellCept 500 mg twice a day, no longer taking Mestinon.  CBC, CMP were normal.  Did really well with dose reduction of CellCept, no worsening symptoms, specifically no double vision, or limb muscle weakness.  Graves' disease is under good control, on lower dose of methimazole, stable.  Needs gabapentin for itching. Has horses, is quite active, barrel racing-she does this well, requires significant strength.  Works full-time as Medical illustrator.  Update September 12, 2020 SS: here today alone, has done well on Cellcept 250 mg twice daily since Jan-Feb,  dose reduction in September took several months to tolerate, having to bump back up the dose to 250 AM/500 PM, felt chest heaviness. This is usual symptom with MG exacerbation, with strength, but she had no weakness this time. Still  active with her horses. No double vision, few eye issues with thyroid eye issues, Graves disease, going back to [facility] in April . Had to increase Methimazole early March . Takes gabapentin 300 mg twice daily itching. Had cervical spine C3 surgery with Dr. Arnoldo Morale back in December, removed rod, put new disc. Neck pain improved with increase in thyroid. Felt itching, neck/back pain with recent thyroid worsening, was hypersensitive to touch. Feeling much better since Methimazole increased, question if lower dose of Cellcept, had any change on Graves.   REVIEW OF SYSTEMS: Out of a complete 14 system review of symptoms, the patient complains only of the following symptoms, and all other reviewed systems are negative.  N/A  ALLERGIES: Allergies  Allergen Reactions  . Sulfa Antibiotics Hives  . Molds & Smuts     HOME MEDICATIONS: Outpatient Medications Prior to Visit  Medication Sig Dispense Refill  . acetaminophen (TYLENOL) 500 MG tablet Take 1,000 mg by mouth every 6 (six) hours as needed for headache.    . anti-nausea (EMETROL) solution Take 10 mLs by mouth every 15 (fifteen) minutes as needed for nausea or vomiting.    Marland Kitchen BREO ELLIPTA 100-25 MCG/INH AEPB Inhale 1 puff into the lungs daily.  3  . citalopram (CELEXA) 20 MG tablet Take 20 mg by mouth daily.    Marland Kitchen estradiol (ESTRACE) 1 MG tablet Take 1 mg by mouth daily.    . fluticasone (VERAMYST) 27.5 MCG/SPRAY nasal spray Place 2 sprays into the nose daily as needed. For allergies    . gabapentin (NEURONTIN) 300 MG capsule Take 1 capsule (300 mg total) by mouth 2 (two) times daily. 180 capsule 4  . HYDROcodone-acetaminophen (NORCO/VICODIN) 5-325 MG tablet Take 1 tablet by mouth every 6 (six) hours as needed for moderate pain.    . hydrOXYzine (ATARAX/VISTARIL) 25 MG tablet Take 25 mg by mouth as needed for itching.     . methimazole (TAPAZOLE) 5 MG tablet Take 1 tablet (5 mg total) by mouth daily. 90 tablet 1  . montelukast  (SINGULAIR) 10 MG tablet Take 10 mg by mouth at bedtime.    . mycophenolate (CELLCEPT) 500 MG tablet Take 0.5 tablets (250 mg total) by mouth 2 (two) times daily. 90 tablet 4  . ondansetron (ZOFRAN ODT) 4 MG disintegrating tablet Take 1 tablet (4 mg total) by mouth every 8 (eight) hours as needed. 20 tablet 0  . OXYBUTYNIN CHLORIDE PO     . Polyethyl Glycol-Propyl Glycol (SYSTANE) 0.4-0.3 % GEL ophthalmic gel Place 1 application into both eyes 3 (three) times daily.    . Probiotic Product (PRO-BIOTIC BLEND PO) Take by mouth.     No facility-administered medications prior to visit.    PAST MEDICAL HISTORY: Past Medical History:  Diagnosis Date  . Environmental allergies   . Graves disease   . Headache(784.0)    last one 1 year ago  . IBS (irritable bowel syndrome)   . Migraines   . PONV (postoperative nausea and vomiting)     PAST SURGICAL HISTORY: Past Surgical History:  Procedure Laterality Date  . ABDOMINAL HYSTERECTOMY  1980's   total  . ANTERIOR CERVICAL DECOMP/DISCECTOMY FUSION  07/29/2011   Procedure: ANTERIOR CERVICAL DECOMPRESSION/DISCECTOMY FUSION 3 LEVEL/HARDWARE REMOVAL;  Surgeon: Leonie Douglas  Arnoldo Morale, MD;  Location: [place] [facility];  Service: Neurosurgery;  Laterality: Bilateral;  Cervical four-five, five-six, six-seven anterior cervical decompresion with fusion, interbody prothesis, plating and bonegraft  . HIP SURGERY  2006   repair post injury  . [facility] SINUS  2005    FAMILY HISTORY: Family History  Problem Relation Age of Onset  . COPD Mother   . Anesthesia problems Neg Hx     SOCIAL HISTORY: Social History   Socioeconomic History  . Marital status: Married    Spouse name: Not on file  . Number of children: Not on file  . Years of education: 53  . Highest education level: Bachelor's degree (e.g., BA, AB, BS)  Occupational History  . Not on file  Tobacco Use  . Smoking status: Former Smoker    Packs/day: 0.50    Years: 20.00    Pack years: 10.00     Types: Cigarettes  . Smokeless tobacco: Never Used  Vaping Use  . Vaping Use: Former  . Substances: Nicotine  Substance and Sexual Activity  . Alcohol use: Yes    Comment: 6 times a year   . Drug use: Never  . Sexual activity: Not on file  Other Topics Concern  . Not on file  Social History Narrative   Lives at home with husband   Left handed   Caffeine: daily, 2 cups    Social Determinants of Health   Financial Resource Strain: Not on file  Food Insecurity: Not on file  Transportation Needs: Not on file  Physical Activity: Not on file  Stress: Not on file  Social Connections: Not on file  Intimate Partner Violence: Not on file   PHYSICAL EXAM  Vitals:   09/12/20 0815  BP: 108/74  Pulse: 60  Weight: 120 lb (54.4 kg)  Height: 4\' 11"  (1.499 m)   Body mass index is 24.24 kg/m.  Generalized: Well developed, in no acute distress   Neurological examination  Mentation: Alert oriented to time, place, history taking. Follows all commands speech and language fluent Cranial nerve II-XII: Pupils were equal round reactive to light. Extraocular movements were full, visual field were full on confrontational test. Facial sensation and strength were normal, no eye closure or cheek puff weakness noted.  With superior gaze for 1 minute, no ptosis or diplopia.  Head turning and shoulder shrug were normal and symmetric. Motor: The motor testing reveals 5 over 5 strength of all 4 extremities. Good symmetric motor tone is noted throughout.  Sensory: Sensory testing is intact to soft touch on all 4 extremities. No evidence of extinction is noted.  Coordination: Cerebellar testing reveals good finger-nose-finger and heel-to-shin bilaterally.  Gait and station: Gait is normal.  Reflexes: Deep tendon reflexes are symmetric and normal bilaterally.   DIAGNOSTIC DATA (LABS, IMAGING, TESTING) - I reviewed patient records, labs, notes, testing and imaging myself where available.  Lab Results   Component Value Date   WBC 5.7 03/14/2020   HGB 14.1 03/14/2020   HCT 43.3 03/14/2020   MCV 93 03/14/2020   PLT 274 03/14/2020      Component Value Date/Time   NA 141 03/14/2020 0840   K 4.5 03/14/2020 0840   CL 103 03/14/2020 0840   CO2 26 03/14/2020 0840   GLUCOSE 94 03/14/2020 0840   GLUCOSE 83 12/22/2019 0726   BUN 17 03/14/2020 0840   CREATININE 0.77 03/14/2020 0840   CALCIUM 9.5 03/14/2020 0840   PROT 6.5 03/14/2020 0840   ALBUMIN 4.7 03/14/2020  0840   AST 14 03/14/2020 0840   ALT 12 03/14/2020 0840   ALKPHOS 66 03/14/2020 0840   BILITOT 0.4 03/14/2020 0840   GFRNONAA 84 03/14/2020 0840   GFRAA 96 03/14/2020 0840   No results found for: CHOL, HDL, LDLCALC, LDLDIRECT, TRIG, CHOLHDL No results found for: HGBA1C No results found for: VITAMINB12 Lab Results  Component Value Date   TSH <0.01 (L) 08/28/2020      ASSESSMENT AND PLAN 65 y.o. year old female  has a past medical history of Environmental allergies, Graves disease, Headache(784.0), IBS (irritable bowel syndrome), Migraines, and PONV (postoperative nausea and vomiting). here with:  1.  Seropositive ocular myasthenia gravis -Confirmed by positive acetylcholine binding antibody -MRI of the brain was normal -CT of chest showed no evidence of thymus pathology -Since March tolerating lower dose of CellCept 500 mg twice a day, without recurrent symptoms of MG -In Sept 2021 lowered dose of Cellcept to 250 mg BID, couldn't maintain until Jan-Feb 2022 due to chest tightness; around same times worsening Graves disease/labs, methimazole was increased in early March, doing better, seeing [facility]my [name] ophthalmology in April; Neuro exam is unremarkable today -Check CBC, CMP today screen for adverse effects of CellCept -Continue current dose of CellCept 250 mg twice daily, not make any changes since methimazole recently increased  -Follow-up in 6 months or sooner if needed with Dr. Krista Blue, call for any worsening symptoms  2.   Graves' disease, hyperthyroidism -under care of endocrinologist -Recent increase of methimazole  I spent 30 minutes of face-to-face and non-face-to-face time with patient.  This included previsit chart review, lab review, study review, order entry, electronic health record documentation, patient education.  Butler Denmark, AGNP-C, DNP 09/12/2020, 8:35 AM [facility] [address] [place], [place] [address] [phone]

## 2020-09-12 NOTE — Patient Instructions (Signed)
Keep Cellcept at current dose Check labs today  Call for any worsening symptoms  See you back in 6 months

## 2020-09-13 LAB — CBC WITH DIFFERENTIAL/PLATELET
Basophils Absolute: 0.1 10*3/uL (ref 0.0–0.2)
Basos: 1 %
EOS (ABSOLUTE): 0.3 10*3/uL (ref 0.0–0.4)
Eos: 5 %
Hematocrit: 45.6 % (ref 34.0–46.6)
Hemoglobin: 15.1 g/dL (ref 11.1–15.9)
Immature Grans (Abs): 0 10*3/uL (ref 0.0–0.1)
Immature Granulocytes: 0 %
Lymphocytes Absolute: 1.9 10*3/uL (ref 0.7–3.1)
Lymphs: 34 %
MCH: 29.9 pg (ref 26.6–33.0)
MCHC: 33.1 g/dL (ref 31.5–35.7)
MCV: 90 fL (ref 79–97)
Monocytes Absolute: 0.4 10*3/uL (ref 0.1–0.9)
Monocytes: 7 %
Neutrophils Absolute: 3.1 10*3/uL (ref 1.4–7.0)
Neutrophils: 53 %
Platelets: 277 10*3/uL (ref 150–450)
RBC: 5.05 x10E6/uL (ref 3.77–5.28)
RDW: 12.4 % (ref 11.7–15.4)
WBC: 5.7 10*3/uL (ref 3.4–10.8)

## 2020-09-13 LAB — COMPREHENSIVE METABOLIC PANEL
ALT: 12 IU/L (ref 0–32)
AST: 18 IU/L (ref 0–40)
Albumin/Globulin Ratio: 2.2 (ref 1.2–2.2)
Albumin: 4.4 g/dL (ref 3.8–4.8)
Alkaline Phosphatase: 72 IU/L (ref 44–121)
BUN/Creatinine Ratio: 18 (ref 12–28)
BUN: 14 mg/dL (ref 8–27)
Bilirubin Total: 0.3 mg/dL (ref 0.0–1.2)
CO2: 22 mmol/L (ref 20–29)
Calcium: 9.4 mg/dL (ref 8.7–10.3)
Chloride: 104 mmol/L (ref 96–106)
Creatinine, Ser: 0.78 mg/dL (ref 0.57–1.00)
Globulin, Total: 2 g/dL (ref 1.5–4.5)
Glucose: 86 mg/dL (ref 65–99)
Potassium: 4.7 mmol/L (ref 3.5–5.2)
Sodium: 142 mmol/L (ref 134–144)
Total Protein: 6.4 g/dL (ref 6.0–8.5)
eGFR: 86 mL/min/{1.73_m2} (ref >59)

## 2020-09-16 ENCOUNTER — Telehealth: Payer: Self-pay

## 2020-09-16 NOTE — Telephone Encounter (Signed)
-----   Message from Tammy Cloud, NP sent at 09/16/2020  5:54 AM EDT ----- Sent my chart message: Metta Clines, Blood work is normal. Let me know if you need anything! Take Care,  Tammy [name]

## 2020-09-18 DIAGNOSIS — Z13220 Encounter for screening for lipoid disorders: Secondary | ICD-10-CM | POA: Diagnosis not present

## 2020-09-18 DIAGNOSIS — E538 Deficiency of other specified B group vitamins: Secondary | ICD-10-CM | POA: Diagnosis not present

## 2020-09-18 DIAGNOSIS — Z0000 Encounter for general adult medical examination without abnormal findings: Secondary | ICD-10-CM | POA: Diagnosis not present

## 2020-09-19 NOTE — Telephone Encounter (Signed)
I have explained to Tammy [name] last week that I didn't think Tammy [name] thyroid per se is causing the question its most likely the immune system that's affecting Tammy [name] skin and thyroid . The skin needs to be treated separately. 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

## 2020-09-19 NOTE — Telephone Encounter (Signed)
Patient called stating the itching she's been experiencing has been getting a lot worse. Asked if a nurse could give her a call at ph# [phone]

## 2020-09-19 NOTE — Telephone Encounter (Signed)
Spoken to patient and she stated even with the increase to 1 tablet a day. The itchy is not getting better, patient stated that she know this is from the thyroid.

## 2020-09-20 NOTE — Telephone Encounter (Signed)
Spoken to patient and notified Dr Quin Hoop comments. However, patient is still stating that all of this are from her Graves's disease. She stated that her voice is getting hoarse, there is some throat pain, and the itchy is getting worse. I told her that I would let you know but reminded her that she needs to contact dermatology and possible primary doctor for addition help.

## 2020-09-25 DIAGNOSIS — G7000 Myasthenia gravis without (acute) exacerbation: Secondary | ICD-10-CM | POA: Diagnosis not present

## 2020-09-25 DIAGNOSIS — Z0000 Encounter for general adult medical examination without abnormal findings: Secondary | ICD-10-CM | POA: Diagnosis not present

## 2020-09-25 DIAGNOSIS — E0500 Thyrotoxicosis with diffuse goiter without thyrotoxic crisis or storm: Secondary | ICD-10-CM | POA: Diagnosis not present

## 2020-09-25 DIAGNOSIS — E782 Mixed hyperlipidemia: Secondary | ICD-10-CM | POA: Diagnosis not present

## 2020-10-02 DIAGNOSIS — E785 Hyperlipidemia, unspecified: Secondary | ICD-10-CM | POA: Diagnosis not present

## 2020-10-02 DIAGNOSIS — E782 Mixed hyperlipidemia: Secondary | ICD-10-CM | POA: Diagnosis not present

## 2020-10-02 DIAGNOSIS — I6523 Occlusion and stenosis of bilateral carotid arteries: Secondary | ICD-10-CM | POA: Diagnosis not present

## 2020-10-07 DIAGNOSIS — H5021 Vertical strabismus, right eye: Secondary | ICD-10-CM | POA: Diagnosis not present

## 2020-10-07 DIAGNOSIS — E0500 Thyrotoxicosis with diffuse goiter without thyrotoxic crisis or storm: Secondary | ICD-10-CM | POA: Diagnosis not present

## 2020-10-07 DIAGNOSIS — H5789 Other specified disorders of eye and adnexa: Secondary | ICD-10-CM | POA: Diagnosis not present

## 2020-10-07 DIAGNOSIS — G7000 Myasthenia gravis without (acute) exacerbation: Secondary | ICD-10-CM | POA: Diagnosis not present

## 2020-10-09 DIAGNOSIS — Z01419 Encounter for gynecological examination (general) (routine) without abnormal findings: Secondary | ICD-10-CM | POA: Diagnosis not present

## 2020-10-09 DIAGNOSIS — Z1272 Encounter for screening for malignant neoplasm of vagina: Secondary | ICD-10-CM | POA: Diagnosis not present

## 2020-10-09 DIAGNOSIS — Z6825 Body mass index (BMI) 25.0-25.9, adult: Secondary | ICD-10-CM | POA: Diagnosis not present

## 2020-10-09 DIAGNOSIS — Z90710 Acquired absence of both cervix and uterus: Secondary | ICD-10-CM | POA: Diagnosis not present

## 2020-10-09 DIAGNOSIS — Z1231 Encounter for screening mammogram for malignant neoplasm of breast: Secondary | ICD-10-CM | POA: Diagnosis not present

## 2020-10-10 DIAGNOSIS — Z01419 Encounter for gynecological examination (general) (routine) without abnormal findings: Secondary | ICD-10-CM | POA: Diagnosis not present

## 2020-10-24 DIAGNOSIS — E0500 Thyrotoxicosis with diffuse goiter without thyrotoxic crisis or storm: Secondary | ICD-10-CM | POA: Diagnosis not present

## 2020-10-25 DIAGNOSIS — M4722 Other spondylosis with radiculopathy, cervical region: Secondary | ICD-10-CM | POA: Diagnosis not present

## 2020-10-29 ENCOUNTER — Other Ambulatory Visit: Payer: BC Managed Care – PPO

## 2021-02-26 DIAGNOSIS — Z5181 Encounter for therapeutic drug level monitoring: Secondary | ICD-10-CM | POA: Diagnosis not present

## 2021-02-26 DIAGNOSIS — E0500 Thyrotoxicosis with diffuse goiter without thyrotoxic crisis or storm: Secondary | ICD-10-CM | POA: Diagnosis not present

## 2021-02-26 DIAGNOSIS — E0590 Thyrotoxicosis, unspecified without thyrotoxic crisis or storm: Secondary | ICD-10-CM | POA: Diagnosis not present

## 2021-03-05 ENCOUNTER — Ambulatory Visit: Payer: BC Managed Care – PPO | Admitting: Internal Medicine

## 2021-03-11 DIAGNOSIS — E0500 Thyrotoxicosis with diffuse goiter without thyrotoxic crisis or storm: Secondary | ICD-10-CM | POA: Diagnosis not present

## 2021-03-11 DIAGNOSIS — R7989 Other specified abnormal findings of blood chemistry: Secondary | ICD-10-CM | POA: Diagnosis not present

## 2021-03-11 DIAGNOSIS — Z5181 Encounter for therapeutic drug level monitoring: Secondary | ICD-10-CM | POA: Diagnosis not present

## 2021-03-25 ENCOUNTER — Ambulatory Visit (INDEPENDENT_AMBULATORY_CARE_PROVIDER_SITE_OTHER): Payer: BC Managed Care – PPO | Admitting: Neurology

## 2021-03-25 ENCOUNTER — Other Ambulatory Visit: Payer: Self-pay

## 2021-03-25 ENCOUNTER — Encounter: Payer: Self-pay | Admitting: Neurology

## 2021-03-25 VITALS — BP 106/74 | HR 72 | Ht 59.0 in | Wt 121.5 lb

## 2021-03-25 DIAGNOSIS — E0500 Thyrotoxicosis with diffuse goiter without thyrotoxic crisis or storm: Secondary | ICD-10-CM | POA: Diagnosis not present

## 2021-03-25 DIAGNOSIS — G7000 Myasthenia gravis without (acute) exacerbation: Secondary | ICD-10-CM | POA: Diagnosis not present

## 2021-03-25 MED ORDER — MYCOPHENOLATE MOFETIL 500 MG PO TABS: 250.0000 mg | ORAL_TABLET | Freq: Two times a day (BID) | ORAL | 4 refills | Status: AC

## 2021-03-25 NOTE — Progress Notes (Signed)
ASSESSMENT AND PLAN 65 y.o. 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 MRI of the brain was normal CT of chest showed no evidence of thymus pathology Started CellCept 500 mg twice a day in March 2021 without recurrent symptoms of MG In Sept 2021 lowered dose of Cellcept to 250 mg BID, complains of chest tightness in Feb 2022, correlate with her wosensing Graves' disease, hyperthyroidism, no recurrent myasthenia gravis symptoms,  Keep current dose of CellCept 250 mg twice a day  She was also diagnosed with mild thyroid eye disease by [facility] ophthalmologist, if she continues to do well, has more stable thyroid function, may consider stop CellCept,  Graves' disease, hyperthyroidism   under care of endocrinologist at [facility] Dr. Horald Chestnut Was on methimazole 5mg  daily,  But developed hypothyroidism, will have repeat laboratory evaluation soon,    HISTORY OF PRESENT ILLNESS:  GALA PADOVANO is a 65 years old female, seen in request by her ophthalmologist Dr. Manuella Ghazi, [place] for evaluation of double vision, initial evaluation was on May 03, 2018.   I have reviewed and summarized the referring note from the referring physician dated April 08, 2018, she was evaluated by her ophthalmologist Dr. 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   Without clear trigger event, patient noticed mild blurry vision when she is driving since September 2019, later she was able to discern it was double vision, double vision gets better closing either left or right eye, the position of the double vision changes, if she closed one eye, the double vision might improve, her double vision also varies, more obvious when she gets tired, and at the end of the day.   She had a history of sinus surgery in 1610, which is complicated by periorbital  infection, misalignment of her vision, which lasted for 3 to 4 months, during that period of time, she did have binocular double vision,   She denies swallowing difficulty, denies limb muscle weakness, denies chewing difficulty,   Recently she also had weight loss.  TSH was significantly decreased 0.008 with elevated free T36.2, free T4 2.45 normal CBC hemoglobin of 14.4, CMP, LDL was 100, cholesterol was 187   Laboratory evaluation on May 09, 2018 showed positive acetylcholine binding antibody, with titer of 1.62, blocking antibody 40,   UPDATE May 31 2018: I personally reviewed MRI report May 18, 2018 that was normal.   Ct chest in November 2019, no evidence of thymoma, diffuse bronchial wall thickening with mild to moderate centrilobular and paraseptal emphysema, consistent with underlying COPD, groundglass attenuation nodule, largest at the left upper lobe 9 x 12 mm,   She has been taking tapering dose of prednisone, now taking prednisone 5mg  3 tabs a day x 2 weeks, and also cellcept 500mg  2 tabs bid, tolerating it well, with mild side effect of acid reflux,   She has not taking Mestinon yet   UPDATE September 11 2019: She has lost follow-up since last visit in December 2019, continue take CellCept 500 mg 2 tablets twice a day, no longer taking Mestinon,   She is under endocrinologist Dr.Shamleffer care, reviewed most recent note on August 24, 2019, hyperthyroidism, Graves' disease, was treated with methimazole since February 2020, infusion of Tepezza in January 2021   Her myasthenia gravis has been under good control, no ptosis, she does complains of fatigue, since better  control of her hyperthyroid disease, she no longer has double vision, she denies difficulty swallowing, or limb muscle weakness, she has not been taking Mestinon, at her worst, she described worsening fatigue, double vision, difficulty getting up from squatting down position  UPDATE Sept 27 2022: I reviewed [facility]  ophthalmology evaluation on October 07, 2020 by ophthalmologist Dr. Leodis Liverpool, Jone Baseman, patient having prism glasses, diplopia in primary gaze at the end of the day and issue with her vision when she is doing computer work, vision was 20/20 in both eye normal pupil examination, fundus examination was normal, hypertropia of right eye, was diagnosed with thyroid eye disease,  Endocrinology evaluation by [facility] on February 26, 2021, Dr. Bud Face, hypothyroidism, with mild Graves' eye disease,  REVIEW OF SYSTEMS: Out of a complete 14 system review of symptoms, the patient complains only of the following symptoms, and all other reviewed systems are negative.  N/A  ALLERGIES: Allergies  Allergen Reactions   Sulfa Antibiotics Hives   Molds & Smuts     HOME MEDICATIONS: Outpatient Medications Prior to Visit  Medication Sig Dispense Refill   acetaminophen (TYLENOL) 500 MG tablet Take 1,000 mg by mouth every 6 (six) hours as needed for headache.     anti-nausea (EMETROL) solution Take 10 mLs by mouth every 15 (fifteen) minutes as needed for nausea or vomiting.     citalopram (CELEXA) 20 MG tablet Take 20 mg by mouth daily.     estradiol (ESTRACE) 1 MG tablet Take 1 mg by mouth daily.     fluticasone (VERAMYST) 27.5 MCG/SPRAY nasal spray Place 2 sprays into the nose daily as needed. For allergies     gabapentin (NEURONTIN) 300 MG capsule Take 300 mg by mouth 2 (two) times daily as needed.     HYDROcodone-acetaminophen (NORCO/VICODIN) 5-325 MG tablet Take 1 tablet by mouth every 6 (six) hours as needed for moderate pain.     hydrOXYzine (ATARAX/VISTARIL) 25 MG tablet Take 25 mg by mouth as needed for itching.      methimazole (TAPAZOLE) 5 MG tablet Take 1 tablet (5 mg total) by mouth daily. 90 tablet 1   montelukast (SINGULAIR) 10 MG tablet Take 10 mg by mouth at bedtime.     mycophenolate (CELLCEPT) 500 MG tablet Take 0.5 tablets (250 mg total) by mouth 2 (two) times daily. 90 tablet 4   ondansetron  (ZOFRAN ODT) 4 MG disintegrating tablet Take 1 tablet (4 mg total) by mouth every 8 (eight) hours as needed. 20 tablet 0   OXYBUTYNIN CHLORIDE PO      Polyethyl Glycol-Propyl Glycol (SYSTANE) 0.4-0.3 % GEL ophthalmic gel Place 1 application into both eyes 3 (three) times daily.     Probiotic Product (PRO-BIOTIC BLEND PO) Take by mouth.     gabapentin (NEURONTIN) 300 MG capsule Take 1 capsule (300 mg total) by mouth 2 (two) times daily. (Patient taking differently: Take 300 mg by mouth 2 (two) times daily as needed.) 180 capsule 4   BREO ELLIPTA 100-25 MCG/INH AEPB Inhale 1 puff into the lungs daily.  3   No facility-administered medications prior to visit.    PAST MEDICAL HISTORY: Past Medical History:  Diagnosis Date   Environmental allergies    Graves disease    Headache(784.0)    last one 1 year ago   IBS (irritable bowel syndrome)    Migraines    PONV (postoperative nausea and vomiting)     PAST SURGICAL HISTORY: Past Surgical History:  Procedure Laterality Date   ABDOMINAL  HYSTERECTOMY  1980's   total   ANTERIOR CERVICAL DECOMP/DISCECTOMY FUSION  07/29/2011   Procedure: ANTERIOR CERVICAL DECOMPRESSION/DISCECTOMY FUSION 3 LEVEL/HARDWARE REMOVAL;  Surgeon: Ophelia Charter, MD;  Location: [facility];  Service: Neurosurgery;  Laterality: Bilateral;  Cervical four-five, five-six, six-seven anterior cervical decompresion with fusion, interbody prothesis, plating and bonegraft   HIP SURGERY  2006   repair post injury   [facility] SINUS  2005    FAMILY HISTORY: Family History  Problem Relation Age of Onset   COPD Mother    Anesthesia problems Neg Hx     SOCIAL HISTORY: Social History   Socioeconomic History   Marital status: Married    Spouse name: Lanae Boast   Number of children: Not on file   Years of education: 18   Highest education level: Bachelor's degree (e.g., BA, AB, BS)  Occupational History   Not on file  Tobacco Use   Smoking status: Former    Packs/day: 0.50     Years: 20.00    Pack years: 10.00    Types: Cigarettes   Smokeless tobacco: Never  Vaping Use   Vaping Use: Former  Substance and Sexual Activity   Alcohol use: Yes    Comment: 6 times a year    Drug use: Never   Sexual activity: Not on file  Other Topics Concern   Not on file  Social History Narrative   Lives at home with husband   Left handed   Drinks 3-4 cups caffeine daily   Social Determinants of Health   Financial Resource Strain: Not on file  Food Insecurity: Not on file  Transportation Needs: Not on file  Physical Activity: Not on file  Stress: Not on file  Social Connections: Not on file  Intimate Partner Violence: Not on file   PHYSICAL EXAM  Vitals:   03/25/21 0906  BP: 106/74  Pulse: 72  Weight: 121 lb 8 oz (55.1 kg)  Height: 4\' 11"  (1.499 m)   Body mass index is 24.54 kg/m.  Generalized: Well developed, in no acute distress   Neurological examination  Mentation: Alert oriented to time, place, history taking. Follows all commands speech and language fluent Cranial nerve II-XII: Pupils were equal round reactive to light. Extraocular movements were full, visual field were full on confrontational test. Facial sensation and strength were normal, no eye closure or cheek puff weakness noted.  With superior gaze for 1 minute, no ptosis or diplopia.  Head turning and shoulder shrug were normal and symmetric. Motor: The motor testing reveals 5 over 5 strength of all 4 extremities. Good symmetric motor tone is noted throughout.  Sensory: Sensory testing is intact to soft touch on all 4 extremities. No evidence of extinction is noted.  Coordination: Cerebellar testing reveals good finger-nose-finger and heel-to-shin bilaterally.  Gait and station: Gait is normal.  Reflexes: Deep tendon reflexes are symmetric and normal bilaterally.   DIAGNOSTIC DATA (LABS, IMAGING, TESTING) - I reviewed patient records, labs, notes, testing and imaging myself where  available.  Lab Results  Component Value Date   WBC 5.7 09/12/2020   HGB 15.1 09/12/2020   HCT 45.6 09/12/2020   MCV 90 09/12/2020   PLT 277 09/12/2020      Component Value Date/Time   NA 142 09/12/2020 0907   K 4.7 09/12/2020 0907   CL 104 09/12/2020 0907   CO2 22 09/12/2020 0907   GLUCOSE 86 09/12/2020 0907   GLUCOSE 83 12/22/2019 0726   BUN 14 09/12/2020 0907  CREATININE 0.78 09/12/2020 0907   CALCIUM 9.4 09/12/2020 0907   PROT 6.4 09/12/2020 0907   ALBUMIN 4.4 09/12/2020 0907   AST 18 09/12/2020 0907   ALT 12 09/12/2020 0907   ALKPHOS 72 09/12/2020 0907   BILITOT 0.3 09/12/2020 0907   GFRNONAA 84 03/14/2020 0840   GFRAA 96 03/14/2020 0840   No results found for: CHOL, HDL, LDLCALC, LDLDIRECT, TRIG, CHOLHDL No results found for: HGBA1C No results found for: VITAMINB12 Lab Results  Component Value Date   TSH <0.01 (L) 08/28/2020

## 2021-05-08 ENCOUNTER — Other Ambulatory Visit: Payer: Self-pay

## 2021-05-08 ENCOUNTER — Ambulatory Visit (INDEPENDENT_AMBULATORY_CARE_PROVIDER_SITE_OTHER): Payer: BC Managed Care – PPO | Admitting: Dermatology

## 2021-05-08 DIAGNOSIS — L720 Epidermal cyst: Secondary | ICD-10-CM | POA: Diagnosis not present

## 2021-05-08 DIAGNOSIS — D692 Other nonthrombocytopenic purpura: Secondary | ICD-10-CM

## 2021-05-08 DIAGNOSIS — Z1283 Encounter for screening for malignant neoplasm of skin: Secondary | ICD-10-CM | POA: Diagnosis not present

## 2021-05-08 DIAGNOSIS — D229 Melanocytic nevi, unspecified: Secondary | ICD-10-CM

## 2021-05-08 DIAGNOSIS — L821 Other seborrheic keratosis: Secondary | ICD-10-CM

## 2021-05-08 DIAGNOSIS — D1800 Hemangioma unspecified site: Secondary | ICD-10-CM

## 2021-05-08 DIAGNOSIS — L820 Inflamed seborrheic keratosis: Secondary | ICD-10-CM | POA: Diagnosis not present

## 2021-05-08 DIAGNOSIS — L814 Other melanin hyperpigmentation: Secondary | ICD-10-CM

## 2021-05-08 DIAGNOSIS — L578 Other skin changes due to chronic exposure to nonionizing radiation: Secondary | ICD-10-CM

## 2021-05-08 NOTE — Progress Notes (Signed)
Follow-Up Visit   Subjective  Tammy [name] is a 65 y.o. female who presents for the following: Follow-up (Patient here today for 1 year tbse. Patient reports a bump at chest that will not go away she would like checked out. ).  Patient here for full body skin exam and skin cancer screening.  The following portions of the chart were reviewed this encounter and updated as appropriate:  Tobacco  Allergies  Meds  Problems  Med Hx  Surg Hx  Fam Hx     Review of Systems: No other skin or systemic complaints except as noted in HPI or Assessment and Plan.  Objective  Well appearing patient in no apparent distress; mood and affect are within normal limits.  A full examination was performed including scalp, head, eyes, ears, nose, lips, neck, chest, axillae, abdomen, back, buttocks, bilateral upper extremities, bilateral lower extremities, hands, feet, fingers, toes, fingernails, and toenails. All findings within normal limits unless otherwise noted below.  chest sternum x 1, left chest x 1, right forehead x 1 (3) Erythematous keratotic or waxy stuck-on papule or plaque.   right medial chest 0.5 cm subcutaneous nodule.   Assessment & Plan  Inflamed seborrheic keratosis chest sternum x 1, left chest x 1, right forehead x 1  Destruction of lesion - chest sternum x 1, left chest x 1, right forehead x 1 Complexity: simple   Destruction method: cryotherapy   Informed consent: discussed and consent obtained   Timeout:  patient name, date of birth, surgical site, and procedure verified Lesion destroyed using liquid nitrogen: Yes   Region frozen until ice ball extended beyond lesion: Yes   Outcome: patient tolerated procedure well with no complications   Post-procedure details: wound care instructions given   Additional details:  Prior to procedure, discussed risks of blister formation, small wound, skin dyspigmentation, or rare scar following cryotherapy. Recommend Vaseline ointment  to treated areas while healing.   Epidermal inclusion cyst right medial chest  Cyst with symptoms and/or recent change.  Discussed surgical excision to remove, including resulting scar and possible recurrence.  Patient will schedule for surgery. Pre-op information given.  Patient would like to have removed   Skin cancer screening  Lentigines - Scattered tan macules - Due to sun exposure - Benign-appearing, observe - Recommend daily broad spectrum sunscreen SPF 30+ to sun-exposed areas, reapply every 2 hours as needed. - Call for any changes  Seborrheic Keratoses - Stuck-on, waxy, tan-brown papules and/or plaques  - Benign-appearing - Discussed benign etiology and prognosis. - Observe - Call for any changes  Purpura - Chronic; persistent and recurrent.  Treatable, but not curable. - Violaceous macules and patches - Benign - Related to trauma, age, sun damage and/or use of blood thinners, chronic use of topical and/or oral steroids - Observe - Can use OTC arnica containing moisturizer such as Dermend Bruise Formula if desired - Call for worsening or other concerns  Melanocytic Nevi - Tan-brown and/or pink-flesh-colored symmetric macules and papules - Benign appearing on exam today - Observation - Call clinic for new or changing moles - Recommend daily use of broad spectrum spf 30+ sunscreen to sun-exposed areas.   Hemangiomas - Red papules - Discussed benign nature - Observe - Call for any changes  Actinic Damage - Chronic condition, secondary to cumulative UV/sun exposure - diffuse scaly erythematous macules with underlying dyspigmentation - Recommend daily broad spectrum sunscreen SPF 30+ to sun-exposed areas, reapply every 2 hours as needed.  - Staying  in the shade or wearing long sleeves, sun glasses (UVA+UVB protection) and wide brim hats (4-inch brim around the entire circumference of the hat) are also recommended for sun protection.  - Call for new or changing  lesions.  Skin cancer screening performed today.  Return for scheduled for cyst surgery before Jan if possible, 1 year tbse . IRuthell Rummage, CMA, am acting as scribe for Sarina Ser, MD. Documentation: I have reviewed the above documentation for accuracy and completeness, and I agree with the above.  Sarina Ser, MD

## 2021-05-08 NOTE — Patient Instructions (Addendum)
Seborrheic Keratosis  What causes seborrheic keratoses? Seborrheic keratoses are harmless, common skin growths that first appear during adult life.  As time goes by, more growths appear.  Some people may develop a large number of them.  Seborrheic keratoses appear on both covered and uncovered body parts.  They are not caused by sunlight.  The tendency to develop seborrheic keratoses can be inherited.  They vary in color from skin-colored to gray, brown, or even black.  They can be either smooth or have a rough, warty surface.   Seborrheic keratoses are superficial and look as if they were stuck on the skin.  Under the microscope this type of keratosis looks like layers upon layers of skin.  That is why at times the top layer may seem to fall off, but the rest of the growth remains and re-grows.    Treatment Seborrheic keratoses do not need to be treated, but can easily be removed in the office.  Seborrheic keratoses often cause symptoms when they rub on clothing or jewelry.  Lesions can be in the way of shaving.  If they become inflamed, they can cause itching, soreness, or burning.  Removal of a seborrheic keratosis can be accomplished by freezing, burning, or surgery. If any spot bleeds, scabs, or grows rapidly, please return to have it checked, as these can be an indication of a skin cancer.  Cryotherapy Aftercare  Wash gently with soap and water everyday.   Apply Vaseline and Band-Aid daily until healed.         Melanoma ABCDEs  Melanoma is the most dangerous type of skin cancer, and is the leading cause of death from skin disease.  You are more likely to develop melanoma if you: Have light-colored skin, light-colored eyes, or red or blond hair Spend a lot of time in the sun Tan regularly, either outdoors or in a tanning bed Have had blistering sunburns, especially during childhood Have a close family member who has had a melanoma Have atypical moles or large birthmarks  Early  detection of melanoma is key since treatment is typically straightforward and cure rates are extremely high if we catch it early.   The first sign of melanoma is often a change in a mole or a new dark spot.  The ABCDE system is a way of remembering the signs of melanoma.  A for asymmetry:  The two halves do not match. B for border:  The edges of the growth are irregular. C for color:  A mixture of colors are present instead of an even brown color. D for diameter:  Melanomas are usually (but not always) greater than 55mm - the size of a pencil eraser. E for evolution:  The spot keeps changing in size, shape, and color.  Please check your skin once per month between visits. You can use a small mirror in front and a large mirror behind you to keep an eye on the back side or your body.   If you see any new or changing lesions before your next follow-up, please call to schedule a visit.  Please continue daily skin protection including broad spectrum sunscreen SPF 30+ to sun-exposed areas, reapplying every 2 hours as needed when you're outdoors.   Staying in the shade or wearing long sleeves, sun glasses (UVA+UVB protection) and wide brim hats (4-inch brim around the entire circumference of the hat) are also recommended for sun protection.    If you have any questions or concerns for your doctor, please  call our main line at [phone] and press option 4 to reach your doctor's medical assistant. If no one answers, please leave a voicemail as directed and we will return your call as soon as possible. Messages left after 4 pm will be answered the following business day.   You may also send [place] a message via [place]. We typically respond to MyChart messages within 1-2 business days.  For prescription refills, please ask your pharmacy to contact our office. Our fax number is [phone].  If you have an urgent issue when the clinic is closed that cannot wait until the next business day, you can page  your doctor at the number below.    Please note that while we do our best to be available for urgent issues outside of office hours, we are not available 24/7.   If you have an urgent issue and are unable to reach [place], you may choose to seek medical care at your doctor's office, retail clinic, urgent care center, or emergency room.  If you have a medical emergency, please immediately call 911 or go to the emergency department.  Pager Numbers  - Dr. Nehemiah Massed: [phone]  - Dr. Laurence Ferrari: [phone]  - Dr. Nicole Kindred: [phone]  In the event of inclement weather, please call our main line at [phone] for an update on the status of any delays or closures.  Dermatology Medication Tips: Please keep the boxes that topical medications come in in order to help keep track of the instructions about where and how to use these. Pharmacies typically print the medication instructions only on the boxes and not directly on the medication tubes.   If your medication is too expensive, please contact our office at [phone] option 4 or send [place] a message through [place].   We are unable to tell what your co-pay for medications will be in advance as this is different depending on your insurance coverage. However, we may be able to find a substitute medication at lower cost or fill out paperwork to get insurance to cover a needed medication.   If a prior authorization is required to get your medication covered by your insurance company, please allow [place] 1-2 business days to complete this process.  Drug prices often vary depending on where the prescription is filled and some pharmacies may offer cheaper prices.  The website www.goodrx.com contains coupons for medications through different pharmacies. The prices here do not account for what the cost may be with help from insurance (it may be cheaper with your insurance), but the website can give you the price if you did not use any insurance.  - You can  print the associated coupon and take it with your prescription to the pharmacy.  - You may also stop by our office during regular business hours and pick up a GoodRx coupon card.  - If you need your prescription sent electronically to a different pharmacy, notify our office through [facility] or by phone at [phone] option 4.

## 2021-05-13 ENCOUNTER — Encounter: Payer: Self-pay | Admitting: Dermatology

## 2021-10-07 ENCOUNTER — Ambulatory Visit: Payer: BC Managed Care – PPO | Admitting: Neurology

## 2021-10-28 ENCOUNTER — Ambulatory Visit (INDEPENDENT_AMBULATORY_CARE_PROVIDER_SITE_OTHER): Payer: BC Managed Care – PPO | Admitting: Neurology

## 2021-10-28 VITALS — BP 128/86 | HR 83 | Ht 59.0 in | Wt 119.0 lb

## 2021-10-28 DIAGNOSIS — G7000 Myasthenia gravis without (acute) exacerbation: Secondary | ICD-10-CM | POA: Diagnosis not present

## 2021-10-28 NOTE — Progress Notes (Signed)
? ? ?Patient: Tammy [name] ?Date of Birth: [birth year] ? ?Reason for Visit: Follow up for MG ?History from: Patient ?Primary Neurologist: Dr. Krista Blue ? ?ASSESSMENT AND PLAN ?66 y.o. year old female  ? ?1.  Seropositive ocular myasthenia gravis ?-Confirmed by positive acetylcholine binding antibody ?-MRI of the brain was normal, CT chest showed no evidence of thymus pathology ?-On CellCept 500 mg twice a day since March 2021 ?-In February 2022 lower dose of CellCept 250 mg BID, reports chest tightness, correlated with worsening Graves' disease, hypothyroidism, no recurrent MG symptoms ?-Consider reducing Cellcept to 250 mg daily, but will check TSH, T4 today, last visit with endocrinology findings were stable ?-With a reduction in dose of CellCept, will closely monitor symptoms, let us know of any worsening, otherwise we will follow-up in 6 months ? ?2.  Graves' disease, hyperthyroidism ?-Seeing endocrinology at [facility], excellent benefit with Tepezza infusion, will have total of 8, just had # 4 ? ?HISTORY  ?HISTORY OF PRESENT ILLNESS: ? Tammy [name] is a 66 years old female, seen in request by her ophthalmologist Dr. Manuella Ghazi, [place] for evaluation of double vision, initial evaluation was on May 03, 2018. ?  ?I have reviewed and summarized the referring note from the referring physician dated April 08, 2018, she was evaluated by her ophthalmologist Dr. 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 ?  ?Without clear trigger event, patient noticed mild blurry vision when she is driving since September 2019, later she was able to discern it was double vision, double vision gets better closing either left or right eye, the position of the double vision changes, if she closed one eye, the double vision might improve, her double vision also varies, more obvious when she gets tired, and at the end of the day. ?  ?She had  a history of sinus surgery in 7829, which is complicated by periorbital infection, misalignment of her vision, which lasted for 3 to 4 months, during that period of time, she did have binocular double vision, ?  ?She denies swallowing difficulty, denies limb muscle weakness, denies chewing difficulty, ?  ?Recently she also had weight loss.  TSH was significantly decreased 0.008 with elevated free T36.2, free T4 2.45 normal CBC hemoglobin of 14.4, CMP, LDL was 100, cholesterol was 187 ?  ?Laboratory evaluation on May 09, 2018 showed positive acetylcholine binding antibody, with titer of 1.62, blocking antibody 40, ?  ?UPDATE May 31 2018: ?I personally reviewed MRI report May 18, 2018 that was normal. ?  ?Ct chest in November 2019, no evidence of thymoma, diffuse bronchial wall thickening with mild to moderate centrilobular and paraseptal emphysema, consistent with underlying COPD, groundglass attenuation nodule, largest at the left upper lobe 9 x 12 mm, ?  ?She has been taking tapering dose of prednisone, now taking prednisone '5mg'$  3 tabs a day x 2 weeks, and also cellcept '500mg'$  2 tabs bid, tolerating it well, with mild side effect of acid reflux, ?  ?She has not taking Mestinon yet ?  ?UPDATE September 11 2019: ?She has lost follow-up since last visit in December 2019, continue take CellCept 500 mg 2 tablets twice a day, no longer taking Mestinon, ?  ?She is under endocrinologist Dr.Shamleffer care, reviewed most recent note on August 24, 2019, hyperthyroidism, Graves' disease, was treated with methimazole since February 2020, infusion of Tepezza in January 2021 ?  ?Her myasthenia gravis has been  under good control, no ptosis, she does complains of fatigue, since better control of her hyperthyroid disease, she no longer has double vision, she denies difficulty swallowing, or limb muscle weakness, she has not been taking Mestinon, at her worst, she described worsening fatigue, double vision, difficulty getting  up from squatting down position ?  ?UPDATE Sept 27 2022: ?I reviewed [place] ophthalmology evaluation on October 07, 2020 by ophthalmologist Dr. Leodis Liverpool, [place], patient having prism glasses, diplopia in primary gaze at the end of the day and issue with her vision when she is doing computer work, vision was 20/20 in both eye normal pupil examination, fundus examination was normal, hypertropia of right eye, was diagnosed with thyroid eye disease, ?  ?Endocrinology evaluation by [facility] on February 26, 2021, Dr. Bud Face, hypothyroidism, with mild Graves' eye disease, ? ?Update Oct 28, 2021 SS: Labs 08/26/21 TSH 3.353, T 4 0.77. Gets Netty Starring for thyroid eye disease # 4 infusion 10/13/21 (total of 8), [facility]mology visit 10/16/2021, review of note indicates thyroid eye disease improved, continue infusions, follow-up in July, she feels much better.  Last visit with endocrinology in December 2022, decided to continue MMI, clinically euthyroid.  Remains on CellCept 250 mg twice daily.  No worsening symptoms, only double vision if she looks right lateral superior.  Big improvement since infusions.  Continues to be active with her horses.  Does have a headache today, not abnormal for her. ? ?REVIEW OF SYSTEMS: Out of a complete 14 system review of symptoms, the patient complains only of the following symptoms, and all other reviewed systems are negative. ? ?See HPI ? ?ALLERGIES: ?Allergies  ?Allergen Reactions  ? Sulfa Antibiotics Hives  ? Molds & Smuts   ? ? ?HOME MEDICATIONS: ?Outpatient Medications Prior to Visit  ?Medication Sig Dispense Refill  ? acetaminophen (TYLENOL) 500 MG tablet Take 1,000 mg by mouth every 6 (six) hours as needed for headache.    ? anti-nausea (EMETROL) solution Take 10 mLs by mouth every 15 (fifteen) minutes as needed for nausea or vomiting.    ? citalopram (CELEXA) 20 MG tablet Take 20 mg by mouth daily.    ? estradiol (ESTRACE) 1 MG tablet Take 1 mg by mouth daily.    ? fluticasone (VERAMYST) 27.5  MCG/SPRAY nasal spray Place 2 sprays into the nose daily as needed. For allergies    ? HYDROcodone-acetaminophen (NORCO/VICODIN) 5-325 MG tablet Take 1 tablet by mouth every 6 (six) hours as needed for moderate pain.    ? hydrOXYzine (ATARAX/VISTARIL) 25 MG tablet Take 25 mg by mouth as needed for itching.     ? methimazole (TAPAZOLE) 5 MG tablet Take 1 tablet (5 mg total) by mouth daily. 90 tablet 1  ? montelukast (SINGULAIR) 10 MG tablet Take 10 mg by mouth at bedtime.    ? mycophenolate (CELLCEPT) 500 MG tablet Take 0.5 tablets (250 mg total) by mouth 2 (two) times daily. 180 tablet 4  ? ondansetron (ZOFRAN ODT) 4 MG disintegrating tablet Take 1 tablet (4 mg total) by mouth every 8 (eight) hours as needed. 20 tablet 0  ? OXYBUTYNIN CHLORIDE PO     ? Polyethyl Glycol-Propyl Glycol (SYSTANE) 0.4-0.3 % GEL ophthalmic gel Place 1 application into both eyes 3 (three) times daily.    ? Probiotic Product (PRO-BIOTIC BLEND PO) Take by mouth.    ? gabapentin (NEURONTIN) 300 MG capsule Take 300 mg by mouth 2 (two) times daily as needed.    ? ?No facility-administered medications prior to visit.  ? ? ?  PAST MEDICAL HISTORY: ?Past Medical History:  ?Diagnosis Date  ? Environmental allergies   ? Graves disease   ? Headache(784.0)   ? last one 1 year ago  ? IBS (irritable bowel syndrome)   ? Migraines   ? PONV (postoperative nausea and vomiting)   ? ? ?PAST SURGICAL HISTORY: ?Past Surgical History:  ?Procedure Laterality Date  ? ABDOMINAL HYSTERECTOMY  1980's  ? total  ? ANTERIOR CERVICAL DECOMP/DISCECTOMY FUSION  07/29/2011  ? Procedure: ANTERIOR CERVICAL DECOMPRESSION/DISCECTOMY FUSION 3 LEVEL/HARDWARE REMOVAL;  Surgeon: Ophelia Charter, MD;  Location: [facility];  Service: Neurosurgery;  Laterality: Bilateral;  Cervical four-five, five-six, six-seven anterior cervical decompresion with fusion, interbody prothesis, plating and bonegraft  ? HIP SURGERY  2006  ? repair post injury  ? [facility] SINUS  2005  ? ? ?FAMILY  HISTORY: ?Family History  ?Problem Relation Age of Onset  ? COPD Mother   ? Anesthesia problems Neg Hx   ? ? ?SOCIAL HISTORY: ?Social History  ? ?Socioeconomic History  ? Marital status: Married  ?  Spouse name: H

## 2021-10-28 NOTE — Patient Instructions (Signed)
Check labs today  ?Depending on labs, consider reducing Cellcept to once daily ?Monitor for any worsening symptoms  ?See you back in 6 months  ?

## 2021-10-28 NOTE — Progress Notes (Signed)
Chart reviewed, agree above plan ?

## 2021-10-29 LAB — CBC WITH DIFFERENTIAL/PLATELET
Basophils Absolute: 0.1 10*3/uL (ref 0.0–0.2)
Basos: 1 %
EOS (ABSOLUTE): 0.1 10*3/uL (ref 0.0–0.4)
Eos: 1 %
Hematocrit: 43.8 % (ref 34.0–46.6)
Hemoglobin: 14.8 g/dL (ref 11.1–15.9)
Immature Grans (Abs): 0 10*3/uL (ref 0.0–0.1)
Immature Granulocytes: 0 %
Lymphocytes Absolute: 1.6 10*3/uL (ref 0.7–3.1)
Lymphs: 19 %
MCH: 31.4 pg (ref 26.6–33.0)
MCHC: 33.8 g/dL (ref 31.5–35.7)
MCV: 93 fL (ref 79–97)
Monocytes Absolute: 0.5 10*3/uL (ref 0.1–0.9)
Monocytes: 6 %
Neutrophils Absolute: 6.3 10*3/uL (ref 1.4–7.0)
Neutrophils: 73 %
Platelets: 271 10*3/uL (ref 150–450)
RBC: 4.72 x10E6/uL (ref 3.77–5.28)
RDW: 12.3 % (ref 11.7–15.4)
WBC: 8.6 10*3/uL (ref 3.4–10.8)

## 2021-10-29 LAB — COMPREHENSIVE METABOLIC PANEL
ALT: 17 IU/L (ref 0–32)
AST: 14 IU/L (ref 0–40)
Albumin/Globulin Ratio: 2.2 (ref 1.2–2.2)
Albumin: 4.3 g/dL (ref 3.8–4.8)
Alkaline Phosphatase: 65 IU/L (ref 44–121)
BUN/Creatinine Ratio: 16 (ref 12–28)
BUN: 13 mg/dL (ref 8–27)
Bilirubin Total: 0.4 mg/dL (ref 0.0–1.2)
CO2: 26 mmol/L (ref 20–29)
Calcium: 9.2 mg/dL (ref 8.7–10.3)
Chloride: 100 mmol/L (ref 96–106)
Creatinine, Ser: 0.83 mg/dL (ref 0.57–1.00)
Globulin, Total: 2 g/dL (ref 1.5–4.5)
Glucose: 90 mg/dL (ref 70–99)
Potassium: 3.8 mmol/L (ref 3.5–5.2)
Sodium: 139 mmol/L (ref 134–144)
Total Protein: 6.3 g/dL (ref 6.0–8.5)
eGFR: 79 mL/min/{1.73_m2} (ref >59)

## 2021-10-29 LAB — TSH: TSH: 4.41 u[IU]/mL (ref 0.450–4.500)

## 2021-10-29 LAB — T4, FREE: Free T4: 1.19 ng/dL (ref 0.82–1.77)

## 2021-12-09 ENCOUNTER — Other Ambulatory Visit: Payer: Self-pay | Admitting: Neurology

## 2021-12-09 DIAGNOSIS — G43109 Migraine with aura, not intractable, without status migrainosus: Secondary | ICD-10-CM

## 2021-12-22 ENCOUNTER — Ambulatory Visit
Admission: RE | Admit: 2021-12-22 | Discharge: 2021-12-22 | Disposition: A | Discharge status: Home / Self Care | Payer: BC Managed Care – PPO | Source: Ambulatory Visit | Attending: Neurology | Admitting: Neurology

## 2021-12-22 DIAGNOSIS — G43109 Migraine with aura, not intractable, without status migrainosus: Secondary | ICD-10-CM | POA: Insufficient documentation

## 2021-12-22 MED ORDER — GADOBUTROL 1 MMOL/ML IV SOLN
5.0000 mL | Freq: Once | INTRAVENOUS | Status: AC | PRN
  Administered 2021-12-22: 5 mL via INTRAVENOUS

## 2022-05-07 ENCOUNTER — Ambulatory Visit: Payer: BC Managed Care – PPO | Admitting: Neurology

## 2022-05-14 ENCOUNTER — Ambulatory Visit (INDEPENDENT_AMBULATORY_CARE_PROVIDER_SITE_OTHER): Payer: BC Managed Care – PPO | Admitting: Dermatology

## 2022-05-14 DIAGNOSIS — L814 Other melanin hyperpigmentation: Secondary | ICD-10-CM

## 2022-05-14 DIAGNOSIS — L603 Nail dystrophy: Secondary | ICD-10-CM | POA: Diagnosis not present

## 2022-05-14 DIAGNOSIS — L821 Other seborrheic keratosis: Secondary | ICD-10-CM

## 2022-05-14 DIAGNOSIS — L578 Other skin changes due to chronic exposure to nonionizing radiation: Secondary | ICD-10-CM

## 2022-05-14 DIAGNOSIS — L820 Inflamed seborrheic keratosis: Secondary | ICD-10-CM

## 2022-05-14 DIAGNOSIS — D692 Other nonthrombocytopenic purpura: Secondary | ICD-10-CM

## 2022-05-14 DIAGNOSIS — D229 Melanocytic nevi, unspecified: Secondary | ICD-10-CM

## 2022-05-14 DIAGNOSIS — Z1283 Encounter for screening for malignant neoplasm of skin: Secondary | ICD-10-CM | POA: Diagnosis not present

## 2022-05-14 NOTE — Progress Notes (Signed)
Follow-Up Visit   Subjective  Tammy [name] is a 66 y.o. female who presents for the following: Annual Exam (1 year tbse. Hx of ak, hx of isk. 2 spots itchy spots at chest, brown spots at left eyebrow. ). The patient presents for Total-Body Skin Exam (TBSE) for skin cancer screening and mole check.  The patient has spots, moles and lesions to be evaluated, some may be new or changing and the patient has concerns that these could be cancer.  The following portions of the chart were reviewed this encounter and updated as appropriate:  Tobacco  Allergies  Meds  Problems  Med Hx  Surg Hx  Fam Hx     Review of Systems: No other skin or systemic complaints except as noted in HPI or Assessment and Plan.  Objective  Well appearing patient in no apparent distress; mood and affect are within normal limits.  A full examination was performed including scalp, head, eyes, ears, nose, lips, neck, chest, axillae, abdomen, back, buttocks, bilateral upper extremities, bilateral lower extremities, hands, feet, fingers, toes, fingernails, and toenails. All findings within normal limits unless otherwise noted below.  chest x 3, left brow x 1, right leg and foot x 7 (10) Erythematous stuck-on, waxy papule or plaque  b/l fingernails Nail dystrophy at fingers b/l hands   Assessment & Plan  Inflamed seborrheic keratosis (10) chest x 3, left brow x 1, right leg and foot x 7 Symptomatic, irritating, patient would like treated. Destruction of lesion - chest x 3, left brow x 1, right leg and foot x 7 Complexity: simple   Destruction method: cryotherapy   Informed consent: discussed and consent obtained   Timeout:  patient name, date of birth, surgical site, and procedure verified Lesion destroyed using liquid nitrogen: Yes   Region frozen until ice ball extended beyond lesion: Yes   Outcome: patient tolerated procedure well with no complications   Post-procedure details: wound care instructions  given   Additional details:  Prior to procedure, discussed risks of blister formation, small wound, skin dyspigmentation, or rare scar following cryotherapy. Recommend Vaseline ointment to treated areas while healing.  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 Condition is symptomatic / bothersome to patient. Not to goal. Recommend Biotin 2.5 mg tab po qd.  Dermanail  apply to nails 2 - 3 days weekly.   Lentigines - Scattered tan macules - Due to sun exposure - Benign-appearing, observe - Recommend daily broad spectrum sunscreen SPF 30+ to sun-exposed areas, reapply every 2 hours as needed. - Call for any changes  Seborrheic Keratoses - Stuck-on, waxy, tan-brown papules and/or plaques  - Benign-appearing - Discussed benign etiology and prognosis. - Observe - Call for any changes  Purpura - Chronic; persistent and recurrent.  Treatable, but not curable. - Violaceous macules and patches - Benign - Related to trauma, age, sun damage and/or use of blood thinners, chronic use of topical and/or oral steroids - Observe - Can use OTC arnica containing moisturizer such as Dermend Bruise Formula if desired - Call for worsening or other concerns  Melanocytic Nevi - Tan-brown and/or pink-flesh-colored symmetric macules and papules - Benign appearing on exam today - Observation - Call clinic for new or changing moles - Recommend daily use of broad spectrum spf 30+ sunscreen to sun-exposed areas.   Hemangiomas - Red papules - Discussed benign nature - Observe - Call for any changes  Actinic Damage -  Chronic condition, secondary to cumulative UV/sun exposure - diffuse scaly erythematous macules with underlying dyspigmentation - Recommend daily broad spectrum sunscreen SPF 30+ to sun-exposed areas, reapply every 2 hours as needed.  - Staying in the shade or wearing long sleeves, sun glasses  (UVA+UVB protection) and wide brim hats (4-inch brim around the entire circumference of the hat) are also recommended for sun protection.  - Call for new or changing lesions.  Skin cancer screening performed today. Return in about 1 year (around 05/15/2023) for TBSE.  IRuthell Rummage, CMA, am acting as scribe for Sarina Ser, MD. Documentation: I have reviewed the above documentation for accuracy and completeness, and I agree with the above.  Sarina Ser, MD

## 2022-05-14 NOTE — Patient Instructions (Addendum)
For nail dystrophy  Recommend biotin 2.5 mg tab by mouth daily  Recommend Dermanail laquer - apply to nails 2 - 3 days weekly.    Seborrheic Keratosis  What causes seborrheic keratoses? Seborrheic keratoses are harmless, common skin growths that first appear during adult life.  As time goes by, more growths appear.  Some people may develop a large number of them.  Seborrheic keratoses appear on both covered and uncovered body parts.  They are not caused by sunlight.  The tendency to develop seborrheic keratoses can be inherited.  They vary in color from skin-colored to gray, brown, or even black.  They can be either smooth or have a rough, warty surface.   Seborrheic keratoses are superficial and look as if they were stuck on the skin.  Under the microscope this type of keratosis looks like layers upon layers of skin.  That is why at times the top layer may seem to fall off, but the rest of the growth remains and re-grows.    Treatment Seborrheic keratoses do not need to be treated, but can easily be removed in the office.  Seborrheic keratoses often cause symptoms when they rub on clothing or jewelry.  Lesions can be in the way of shaving.  If they become inflamed, they can cause itching, soreness, or burning.  Removal of a seborrheic keratosis can be accomplished by freezing, burning, or surgery. If any spot bleeds, scabs, or grows rapidly, please return to have it checked, as these can be an indication of a skin cancer.  Cryotherapy Aftercare  Wash gently with soap and water everyday.   Apply Vaseline and Band-Aid daily until healed.     Melanoma ABCDEs  Melanoma is the most dangerous type of skin cancer, and is the leading cause of death from skin disease.  You are more likely to develop melanoma if you: Have light-colored skin, light-colored eyes, or red or blond hair Spend a lot of time in the sun Tan regularly, either outdoors or in a tanning bed Have had blistering sunburns,  especially during childhood Have a close family member who has had a melanoma Have atypical moles or large birthmarks  Early detection of melanoma is key since treatment is typically straightforward and cure rates are extremely high if we catch it early.   The first sign of melanoma is often a change in a mole or a new dark spot.  The ABCDE system is a way of remembering the signs of melanoma.  A for asymmetry:  The two halves do not match. B for border:  The edges of the growth are irregular. C for color:  A mixture of colors are present instead of an even brown color. D for diameter:  Melanomas are usually (but not always) greater than 33m - the size of a pencil eraser. E for evolution:  The spot keeps changing in size, shape, and color.  Please check your skin once per month between visits. You can use a small mirror in front and a large mirror behind you to keep an eye on the back side or your body.   If you see any new or changing lesions before your next follow-up, please call to schedule a visit.  Please continue daily skin protection including broad spectrum sunscreen SPF 30+ to sun-exposed areas, reapplying every 2 hours as needed when you're outdoors.   Staying in the shade or wearing long sleeves, sun glasses (UVA+UVB protection) and wide brim hats (4-inch brim around the entire  circumference of the hat) are also recommended for sun protection.    Due to recent changes in healthcare laws, you may see results of your pathology and/or laboratory studies on MyChart before the doctors have had a chance to review them. We understand that in some cases there may be results that are confusing or concerning to you. Please understand that not all results are received at the same time and often the doctors may need to interpret multiple results in order to provide you with the best plan of care or course of treatment. Therefore, we ask that you please give [place] 2 business days to thoroughly  review all your results before contacting the office for clarification. Should we see a critical lab result, you will be contacted sooner.   If You Need Anything After Your Visit  If you have any questions or concerns for your doctor, please call our main line at [phone] and press option 4 to reach your doctor's medical assistant. If no one answers, please leave a voicemail as directed and we will return your call as soon as possible. Messages left after 4 pm will be answered the following business day.   You may also send [place] a message via [place]. We typically respond to MyChart messages within 1-2 business days.  For prescription refills, please ask your pharmacy to contact our office. Our fax number is [phone].  If you have an urgent issue when the clinic is closed that cannot wait until the next business day, you can page your doctor at the number below.    Please note that while we do our best to be available for urgent issues outside of office hours, we are not available 24/7.   If you have an urgent issue and are unable to reach [place], you may choose to seek medical care at your doctor's office, retail clinic, urgent care center, or emergency room.  If you have a medical emergency, please immediately call 911 or go to the emergency department.  Pager Numbers  - Dr. Nehemiah Massed: [phone]  - Dr. Laurence Ferrari: [phone]  - Dr. Nicole Kindred: [phone]  In the event of inclement weather, please call our main line at [phone] for an update on the status of any delays or closures.  Dermatology Medication Tips: Please keep the boxes that topical medications come in in order to help keep track of the instructions about where and how to use these. Pharmacies typically print the medication instructions only on the boxes and not directly on the medication tubes.   If your medication is too expensive, please contact our office at [phone] option 4 or send [place] a message through  [place].   We are unable to tell what your co-pay for medications will be in advance as this is different depending on your insurance coverage. However, we may be able to find a substitute medication at lower cost or fill out paperwork to get insurance to cover a needed medication.   If a prior authorization is required to get your medication covered by your insurance company, please allow [place] 1-2 business days to complete this process.  Drug prices often vary depending on where the prescription is filled and some pharmacies may offer cheaper prices.  The website www.goodrx.com contains coupons for medications through different pharmacies. The prices here do not account for what the cost may be with help from insurance (it may be cheaper with your insurance), but the website can give you the price if you did not use  any insurance.  - You can print the associated coupon and take it with your prescription to the pharmacy.  - You may also stop by our office during regular business hours and pick up a GoodRx coupon card.  - If you need your prescription sent electronically to a different pharmacy, notify our office through [facility] or by phone at [phone] option 4.     Si Usted Necesita Algo Despus de Su Visita  Tambin puede enviarnos un mensaje a travs de Pharmacist, community. Por lo general respondemos a los mensajes de MyChart en el transcurso de 1 a 2 das hbiles.  Para renovar recetas, por favor pida a su farmacia que se ponga en contacto con nuestra oficina. Harland Dingwall de fax es [place] [phone].  Si tiene un asunto urgente cuando la clnica est cerrada y que no puede esperar hasta el siguiente da hbil, puede llamar/localizar a su doctor(a) al nmero que aparece a continuacin.   Por favor, tenga en cuenta que aunque hacemos todo lo posible para estar disponibles para asuntos urgentes fuera del horario de [place], no estamos disponibles las 24 horas del da, los 7 das de la  [place].   Si tiene un problema urgente y no puede comunicarse con nosotros, puede optar por buscar atencin mdica  en el consultorio de su doctor(a), en una clnica privada, en un centro de atencin urgente o en una sala de emergencias.  Si tiene Engineering geologist, por favor llame inmediatamente al 911 o vaya a la sala de emergencias.  Nmeros de bper  - Dr. Nehemiah Massed: [phone]  - Dra. Moye: [phone]  - Dra. Nicole Kindred: [phone]  En caso de inclemencias del [place], por favor llame a Johnsie Kindred principal al [phone] para una actualizacin sobre el [place] de cualquier retraso o cierre.  Consejos para la medicacin en dermatologa: Por favor, guarde las cajas en las que vienen los medicamentos de uso tpico para ayudarle a seguir las instrucciones sobre dnde y cmo usarlos. Las farmacias generalmente imprimen las instrucciones del medicamento slo en las cajas y no directamente en los tubos del [place].   Si su medicamento es muy caro, por favor, pngase en contacto con Zigmund Daniel llamando al [phone] y presione la opcin 4 o envenos un mensaje a travs de Pharmacist, community.   No podemos decirle cul ser su copago por los medicamentos por adelantado ya que esto es diferente dependiendo de la cobertura de su seguro. Sin embargo, es posible que podamos encontrar un medicamento sustituto a Electrical engineer un formulario para que el seguro cubra el medicamento que se considera necesario.   Si se requiere una autorizacin previa para que su compaa de seguros [place] su medicamento, por favor permtanos de 1 a 2 das hbiles para completar este proceso.  Los precios de los medicamentos varan con frecuencia dependiendo del Environmental consultant de dnde se surte la receta y alguna farmacias pueden ofrecer precios ms baratos.  El sitio web www.goodrx.com tiene cupones para medicamentos de Airline pilot. Los precios aqu no tienen en cuenta lo que podra costar con la ayuda del  seguro (puede ser ms barato con su seguro), pero el sitio web puede darle el precio si no utiliz Research scientist (physical sciences).  - Puede imprimir el cupn correspondiente y llevarlo con su receta a la farmacia.  - Tambin puede pasar por nuestra oficina durante el horario de atencin regular y Charity fundraiser una tarjeta de cupones de GoodRx.  - Si necesita que su receta se enve electrnicamente a  una farmacia diferente, informe a nuestra oficina a travs de MyChart de [place] o por telfono llamando al [phone] y presione la opcin 4.

## 2022-06-02 ENCOUNTER — Encounter: Payer: Self-pay | Admitting: Dermatology

## 2023-04-22 ENCOUNTER — Other Ambulatory Visit: Payer: Self-pay | Admitting: Internal Medicine

## 2023-04-22 DIAGNOSIS — Z0000 Encounter for general adult medical examination without abnormal findings: Secondary | ICD-10-CM

## 2023-06-03 ENCOUNTER — Ambulatory Visit: Payer: Medicare Other | Admitting: Dermatology

## 2023-06-03 ENCOUNTER — Encounter: Payer: Self-pay | Admitting: Dermatology

## 2023-06-03 DIAGNOSIS — L578 Other skin changes due to chronic exposure to nonionizing radiation: Secondary | ICD-10-CM

## 2023-06-03 DIAGNOSIS — Z7189 Other specified counseling: Secondary | ICD-10-CM

## 2023-06-03 DIAGNOSIS — Z872 Personal history of diseases of the skin and subcutaneous tissue: Secondary | ICD-10-CM

## 2023-06-03 DIAGNOSIS — W908XXA Exposure to other nonionizing radiation, initial encounter: Secondary | ICD-10-CM

## 2023-06-03 DIAGNOSIS — L814 Other melanin hyperpigmentation: Secondary | ICD-10-CM

## 2023-06-03 DIAGNOSIS — D229 Melanocytic nevi, unspecified: Secondary | ICD-10-CM

## 2023-06-03 DIAGNOSIS — L603 Nail dystrophy: Secondary | ICD-10-CM

## 2023-06-03 DIAGNOSIS — Z1283 Encounter for screening for malignant neoplasm of skin: Secondary | ICD-10-CM | POA: Diagnosis not present

## 2023-06-03 DIAGNOSIS — L821 Other seborrheic keratosis: Secondary | ICD-10-CM

## 2023-06-03 DIAGNOSIS — Z79899 Other long term (current) drug therapy: Secondary | ICD-10-CM

## 2023-06-03 DIAGNOSIS — D692 Other nonthrombocytopenic purpura: Secondary | ICD-10-CM

## 2023-06-03 DIAGNOSIS — D1801 Hemangioma of skin and subcutaneous tissue: Secondary | ICD-10-CM

## 2023-06-03 NOTE — Progress Notes (Signed)
   Follow-Up Visit   Subjective  Tammy [name] is a 67 y.o. female who presents for the following: Skin Cancer Screening and Full Body Skin Exam  The patient presents for Total-Body Skin Exam (TBSE) for skin cancer screening and mole check. The patient has spots, moles and lesions to be evaluated, some may be new or changing and the patient may have concern these could be cancer.  No hx skin cancer. Patient does take Biotin for nail dystrophy, she used Dermanail but it did not seem to help.   The following portions of the chart were reviewed this encounter and updated as appropriate: medications, allergies, medical history  Review of Systems:  No other skin or systemic complaints except as noted in HPI or Assessment and Plan.  Objective  Well appearing patient in no apparent distress; mood and affect are within normal limits.  A full examination was performed including scalp, head, eyes, ears, nose, lips, neck, chest, axillae, abdomen, back, buttocks, bilateral upper extremities, bilateral lower extremities, hands, feet, fingers, toes, fingernails, and toenails. All findings within normal limits unless otherwise noted below.   Relevant physical exam findings are noted in the Assessment and Plan.    Assessment & Plan   SKIN CANCER SCREENING PERFORMED TODAY.  ACTINIC DAMAGE - Chronic condition, secondary to cumulative UV/sun exposure - diffuse scaly erythematous macules with underlying dyspigmentation - Recommend daily broad spectrum sunscreen SPF 30+ to sun-exposed areas, reapply every 2 hours as needed.  - Staying in the shade or wearing long sleeves, sun glasses (UVA+UVB protection) and wide brim hats (4-inch brim around the entire circumference of the hat) are also recommended for sun protection.  - Call for new or changing lesions.  LENTIGINES, SEBORRHEIC KERATOSES, HEMANGIOMAS - Benign normal skin lesions - Benign-appearing - Call for any changes  MELANOCYTIC NEVI -  Tan-brown and/or pink-flesh-colored symmetric macules and papules - Benign appearing on exam today - Observation - Call clinic for new or changing moles - Recommend daily use of broad spectrum spf 30+ sunscreen to sun-exposed areas.   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 Condition is symptomatic / bothersome to patient. Not to goal. Recommend Biotin 2.5 mg tab po every day, Cutemol.  Dermanail  apply to nails 2 - 3 days weekly.   Patient noticed nail changes as well as significant hair loss after having Tepezza infusions for Graves eye disease.   Purpura - Chronic; persistent and recurrent.  Treatable, but not curable. - Violaceous macules and patches at arms and legs - Benign - Related to trauma, age, sun damage and/or use of blood thinners, chronic use of topical and/or oral steroids - Observe - Can use OTC arnica containing moisturizer such as Dermend Bruise Formula if desired - Call for worsening or other concerns  Hx of hives currently resolved.   Return in about 1 year (around 06/02/2024) for TBSE, with Dr. Beverlyn Roux, RMA, am acting as scribe for Armida Sans, MD .   Documentation: I have reviewed the above documentation for accuracy and completeness, and I agree with the above.  Armida Sans, MD

## 2023-06-03 NOTE — Patient Instructions (Signed)
Recommend Biotin 2.5 mg tab po every day, Cutemol.  Dermanail  apply to nails 2 - 3 days weekly.   Melanoma ABCDEs  Melanoma is the most dangerous type of skin cancer, and is the leading cause of death from skin disease.  You are more likely to develop melanoma if you: Have light-colored skin, light-colored eyes, or red or blond hair Spend a lot of time in the sun Tan regularly, either outdoors or in a tanning bed Have had blistering sunburns, especially during childhood Have a close family member who has had a melanoma Have atypical moles or large birthmarks  Early detection of melanoma is key since treatment is typically straightforward and cure rates are extremely high if we catch it early.   The first sign of melanoma is often a change in a mole or a new dark spot.  The ABCDE system is a way of remembering the signs of melanoma.  A for asymmetry:  The two halves do not match. B for border:  The edges of the growth are irregular. C for color:  A mixture of colors are present instead of an even brown color. D for diameter:  Melanomas are usually (but not always) greater than 6mm - the size of a pencil eraser. E for evolution:  The spot keeps changing in size, shape, and color.  Please check your skin once per month between visits. You can use a small mirror in front and a large mirror behind you to keep an eye on the back side or your body.   If you see any new or changing lesions before your next follow-up, please call to schedule a visit.  Please continue daily skin protection including broad spectrum sunscreen SPF 30+ to sun-exposed areas, reapplying every 2 hours as needed when you're outdoors.    Due to recent changes in healthcare laws, you may see results of your pathology and/or laboratory studies on MyChart before the doctors have had a chance to review them. We understand that in some cases there may be results that are confusing or concerning to you. Please understand that  not all results are received at the same time and often the doctors may need to interpret multiple results in order to provide you with the best plan of care or course of treatment. Therefore, we ask that you please give [place] 2 business days to thoroughly review all your results before contacting the office for clarification. Should we see a critical lab result, you will be contacted sooner.   If You Need Anything After Your Visit  If you have any questions or concerns for your doctor, please call our main line at [phone] and press option 4 to reach your doctor's medical assistant. If no one answers, please leave a voicemail as directed and we will return your call as soon as possible. Messages left after 4 pm will be answered the following business day.   You may also send [place] a message via MyChart. We typically respond to MyChart messages within 1-2 business days.  For prescription refills, please ask your pharmacy to contact our office. Our fax number is [phone].  If you have an urgent issue when the clinic is closed that cannot wait until the next business day, you can page your doctor at the number below.    Please note that while we do our best to be available for urgent issues outside of office hours, we are not available 24/7.   If you have an urgent  issue and are unable to reach [place], you may choose to seek medical care at your doctor's office, retail clinic, urgent care center, or emergency room.  If you have a medical emergency, please immediately call 911 or go to the emergency department.  Pager Numbers  - Dr. Gwen Pounds: [phone]  - Dr. Roseanne Reno: [phone]  - Dr. Katrinka Blazing: [phone]   In the event of inclement weather, please call our main line at [phone] for an update on the status of any delays or closures.  Dermatology Medication Tips: Please keep the boxes that topical medications come in in order to help keep track of the instructions about where and how  to use these. Pharmacies typically print the medication instructions only on the boxes and not directly on the medication tubes.   If your medication is too expensive, please contact our office at [phone] option 4 or send [place] a message through MyChart.   We are unable to tell what your co-pay for medications will be in advance as this is different depending on your insurance coverage. However, we may be able to find a substitute medication at lower cost or fill out paperwork to get insurance to cover a needed medication.   If a prior authorization is required to get your medication covered by your insurance company, please allow [place] 1-2 business days to complete this process.  Drug prices often vary depending on where the prescription is filled and some pharmacies may offer cheaper prices.  The website www.goodrx.com contains coupons for medications through different pharmacies. The prices here do not account for what the cost may be with help from insurance (it may be cheaper with your insurance), but the website can give you the price if you did not use any insurance.  - You can print the associated coupon and take it with your prescription to the pharmacy.  - You may also stop by our office during regular business hours and pick up a GoodRx coupon card.  - If you need your prescription sent electronically to a different pharmacy, notify our office through [facility] or by phone at [phone] option 4.     Si Usted Necesita Algo Despus de Su Visita  Tambin puede enviarnos un mensaje a travs de Clinical cytogeneticist. Por lo general respondemos a los mensajes de MyChart en el transcurso de 1 a 2 das hbiles.  Para renovar recetas, por favor pida a su farmacia que se ponga en contacto con nuestra oficina. Annie Sable de fax es [place] [phone].  Si tiene un asunto urgente cuando la clnica est cerrada y que no puede esperar hasta el siguiente da hbil, puede llamar/localizar a su  doctor(a) al nmero que aparece a continuacin.   Por favor, tenga en cuenta que aunque hacemos todo lo posible para estar disponibles para asuntos urgentes fuera del horario de [place], no estamos disponibles las 24 horas del da, los 7 [address]  [address] de la [place].   Si tiene un problema urgente y no puede comunicarse con nosotros, puede optar por buscar atencin mdica  en el consultorio de su doctor(a), en una clnica privada, en un centro de atencin urgente o en una sala de emergencias.  Si tiene Engineer, drilling, por favor llame inmediatamente al 911 o vaya a la sala de emergencias.  Nmeros de bper  - Dr. Gwen Pounds: [phone]  - Dra. Roseanne Reno: [phone]  - Dr. Katrinka Blazing: [phone]   En caso de inclemencias del tiempo, por favor llame a nuestra lnea principal al [phone] para  una actualizacin [employer] de cualquier retraso o cierre.  Consejos para la medicacin en dermatologa: Por favor, guarde las cajas en las que vienen los medicamentos de uso tpico para ayudarle a seguir las instrucciones sobre dnde y cmo usarlos. Las farmacias generalmente imprimen las instrucciones del medicamento slo en las cajas y no directamente en los tubos del [place].   Si su medicamento es muy caro, por favor, pngase en contacto con Rolm Gala llamando al [phone] y presione la opcin 4 o envenos un mensaje a travs de Clinical cytogeneticist.   No podemos decirle cul ser su copago por los medicamentos por adelantado ya que esto es diferente dependiendo de la cobertura de su seguro. Sin embargo, es posible que podamos encontrar un medicamento sustituto a Audiological scientist un formulario para que el seguro cubra el medicamento que se considera necesario.   Si se requiere una autorizacin previa para que su compaa de seguros [place] su medicamento, por favor permtanos de 1 a 2 das hbiles para completar [address]  Los precios de los medicamentos varan con frecuencia dependiendo del  Environmental consultant de dnde se surte la receta y alguna farmacias pueden ofrecer precios ms baratos.  El sitio web www.goodrx.com tiene cupones para medicamentos de Health and safety inspector. Los precios aqu no tienen en cuenta lo que podra costar con la ayuda del seguro (puede ser ms barato con su seguro), pero el sitio web puede darle el precio si no utiliz Tourist information centre manager.  - Puede imprimir el cupn correspondiente y llevarlo con su receta a la farmacia.  - Tambin puede pasar por nuestra oficina durante el horario de atencin regular y Education officer, museum una tarjeta de cupones de GoodRx.  - Si necesita que su receta se enve electrnicamente a una farmacia diferente, informe a nuestra oficina a travs de MyChart de [place] o por telfono llamando al [phone] y presione la opcin 4.

## 2023-10-04 ENCOUNTER — Other Ambulatory Visit: Payer: Self-pay | Admitting: Internal Medicine

## 2023-10-04 DIAGNOSIS — G7000 Myasthenia gravis without (acute) exacerbation: Secondary | ICD-10-CM

## 2023-10-04 DIAGNOSIS — E782 Mixed hyperlipidemia: Secondary | ICD-10-CM

## 2023-10-15 ENCOUNTER — Ambulatory Visit
Admission: RE | Admit: 2023-10-15 | Discharge: 2023-10-15 | Disposition: A | Discharge status: Home / Self Care | Payer: Self-pay | Source: Ambulatory Visit | Attending: Internal Medicine | Admitting: Internal Medicine

## 2023-10-15 DIAGNOSIS — G7000 Myasthenia gravis without (acute) exacerbation: Secondary | ICD-10-CM | POA: Insufficient documentation

## 2023-10-15 DIAGNOSIS — E782 Mixed hyperlipidemia: Secondary | ICD-10-CM | POA: Insufficient documentation

## 2024-06-07 ENCOUNTER — Ambulatory Visit: Payer: Medicare Other | Admitting: Dermatology

## 2024-06-07 ENCOUNTER — Ambulatory Visit: Admitting: Dermatology

## 2024-06-07 ENCOUNTER — Encounter: Payer: Self-pay | Admitting: Dermatology

## 2024-06-07 DIAGNOSIS — Z7189 Other specified counseling: Secondary | ICD-10-CM

## 2024-06-07 DIAGNOSIS — L814 Other melanin hyperpigmentation: Secondary | ICD-10-CM

## 2024-06-07 DIAGNOSIS — W908XXA Exposure to other nonionizing radiation, initial encounter: Secondary | ICD-10-CM

## 2024-06-07 DIAGNOSIS — L720 Epidermal cyst: Secondary | ICD-10-CM | POA: Diagnosis not present

## 2024-06-07 DIAGNOSIS — Z8619 Personal history of other infectious and parasitic diseases: Secondary | ICD-10-CM | POA: Diagnosis not present

## 2024-06-07 DIAGNOSIS — L821 Other seborrheic keratosis: Secondary | ICD-10-CM | POA: Diagnosis not present

## 2024-06-07 DIAGNOSIS — D692 Other nonthrombocytopenic purpura: Secondary | ICD-10-CM

## 2024-06-07 DIAGNOSIS — Z79899 Other long term (current) drug therapy: Secondary | ICD-10-CM

## 2024-06-07 DIAGNOSIS — L578 Other skin changes due to chronic exposure to nonionizing radiation: Secondary | ICD-10-CM

## 2024-06-07 DIAGNOSIS — Z1283 Encounter for screening for malignant neoplasm of skin: Secondary | ICD-10-CM | POA: Diagnosis not present

## 2024-06-07 DIAGNOSIS — D229 Melanocytic nevi, unspecified: Secondary | ICD-10-CM

## 2024-06-07 NOTE — Progress Notes (Signed)
 Follow-Up Visit   Subjective  Tammy [name] is a 68 y.o. female who presents for the following: Skin Cancer Screening and Full Body Skin Exam Hx of aks and isks  Some areas on spots that comes back A small sore scrubs off and comes back notice over a year ago  The patient presents for Total-Body Skin Exam (TBSE) for skin cancer screening and mole check. The patient has spots, moles and lesions to be evaluated, some may be new or changing and the patient may have concern these could be cancer.  The following portions of the chart were reviewed this encounter and updated as appropriate: medications, allergies, medical history  Review of Systems:  No other skin or systemic complaints except as noted in HPI or Assessment and Plan.  Objective  Well appearing patient in no apparent distress; mood and affect are within normal limits.  A full examination was performed including scalp, head, eyes, ears, nose, lips, neck, chest, axillae, abdomen, back, buttocks, bilateral upper extremities, bilateral lower extremities, hands, feet, fingers, toes, fingernails, and toenails. All findings within normal limits unless otherwise noted below.   Relevant physical exam findings are noted in the Assessment and Plan.    Assessment & Plan   SKIN CANCER SCREENING PERFORMED TODAY.  ACTINIC DAMAGE - Chronic condition, secondary to cumulative UV/sun exposure - diffuse scaly erythematous macules with underlying dyspigmentation - Recommend daily broad spectrum sunscreen SPF 30+ to sun-exposed areas, reapply every 2 hours as needed.  - Staying in the shade or wearing long sleeves, sun glasses (UVA+UVB protection) and wide brim hats (4-inch brim around the entire circumference of the hat) are also recommended for sun protection.  - Call for new or changing lesions.  LENTIGINES, SEBORRHEIC KERATOSES, HEMANGIOMAS - Benign normal skin lesions - Benign-appearing - Call for any changes  - Sks at chest if  bothersome can be treated   MELANOCYTIC NEVI - Tan-brown and/or pink-flesh-colored symmetric macules and papules - Benign appearing on exam today - Observation - Call clinic for new or changing moles - Recommend daily use of broad spectrum spf 30+ sunscreen to sun-exposed areas.   Purpura - Chronic; persistent and recurrent.  Treatable, but not curable. - Violaceous macules and patches - Benign - Related to trauma, age, sun damage and/or use of blood thinners, chronic use of topical and/or oral steroids - Observe - Can use OTC arnica containing moisturizer such as Dermend Bruise Formula if desired - Call for worsening or other concerns  MILIA Exam: tiny erythematous firm white papule Discussed this is a type of cyst. Benign-appearing. Sometimes these will clear with OTC adapalene/Differin 0.1% cream QHS or retinol.  Discussed extraction if symptomatic.   HISTORY OF COLD SORES  Exam: clear today Treatment Plan: Discussed valtrex Patient declined  Discussed otc cream and prescription oral or topical treatment  Patient will call if she would like rx  Purpura - Chronic; persistent and recurrent.  Treatable, but not curable. - Violaceous macules and patches at arms and legs - Benign - Related to trauma, age, sun damage and/or use of blood thinners, chronic use of topical and/or oral steroids - Observe - Can use OTC arnica containing moisturizer such as Dermend Bruise Formula if desired - Call for worsening or other concerns  Return in about 1 year (around 06/07/2025) for TBSE.   LILLETTE Eleanor Blush, CMA, am acting as scribe for Alm Rhyme, MD.   Documentation: I have reviewed the above documentation for accuracy and completeness, and I agree  with the above.  Alm Rhyme, MD

## 2024-06-07 NOTE — Patient Instructions (Signed)

## 2025-06-07 ENCOUNTER — Ambulatory Visit: Admitting: Dermatology
